# Patient Record
Sex: Female | Born: 1955 | Race: White | Hispanic: No | Smoking: Never smoker
Health system: Southern US, Community
[De-identification: ages and names within clinical notes are randomized; demographics above are authoritative.]

## PROBLEM LIST (undated history)

## (undated) ENCOUNTER — Emergency Department (HOSPITAL_COMMUNITY): Admission: EM | Payer: 59 | Source: Home / Self Care

## (undated) DIAGNOSIS — R21 Rash and other nonspecific skin eruption: Secondary | ICD-10-CM

## (undated) DIAGNOSIS — K5090 Crohn's disease, unspecified, without complications: Secondary | ICD-10-CM

## (undated) DIAGNOSIS — Z933 Colostomy status: Secondary | ICD-10-CM

## (undated) DIAGNOSIS — N950 Postmenopausal bleeding: Secondary | ICD-10-CM

## (undated) HISTORY — PX: TONSILLECTOMY: SUR1361

---

## 2006-07-02 ENCOUNTER — Other Ambulatory Visit: Admission: RE | Admit: 2006-07-02 | Discharge: 2006-07-02 | Payer: Self-pay | Admitting: Obstetrics & Gynecology

## 2006-07-28 ENCOUNTER — Encounter: Admission: RE | Admit: 2006-07-28 | Discharge: 2006-07-28 | Payer: Self-pay | Admitting: Obstetrics and Gynecology

## 2007-05-12 HISTORY — PX: HYSTEROSCOPY WITH D & C: SHX1775

## 2007-07-30 ENCOUNTER — Emergency Department (HOSPITAL_COMMUNITY): Admission: EM | Admit: 2007-07-30 | Discharge: 2007-07-30 | Payer: Self-pay | Admitting: Family Medicine

## 2010-02-07 ENCOUNTER — Ambulatory Visit (HOSPITAL_COMMUNITY): Admission: RE | Admit: 2010-02-07 | Discharge: 2010-02-07 | Payer: Self-pay | Admitting: Gastroenterology

## 2010-12-29 ENCOUNTER — Other Ambulatory Visit (HOSPITAL_COMMUNITY): Payer: Self-pay | Admitting: Gastroenterology

## 2010-12-29 ENCOUNTER — Ambulatory Visit (HOSPITAL_COMMUNITY)
Admission: RE | Admit: 2010-12-29 | Discharge: 2010-12-29 | Disposition: A | Discharge status: Home / Self Care | Payer: 59 | Source: Ambulatory Visit | Attending: Gastroenterology | Admitting: Gastroenterology

## 2010-12-29 DIAGNOSIS — Q619 Cystic kidney disease, unspecified: Secondary | ICD-10-CM | POA: Insufficient documentation

## 2010-12-29 DIAGNOSIS — R1033 Periumbilical pain: Secondary | ICD-10-CM

## 2010-12-29 DIAGNOSIS — K5010 Crohn's disease of large intestine without complications: Secondary | ICD-10-CM

## 2010-12-29 MED ORDER — IOHEXOL 300 MG/ML  SOLN
100.0000 mL | Freq: Once | INTRAMUSCULAR | Status: AC | PRN
  Administered 2010-12-29: 100 mL via INTRAVENOUS

## 2011-01-08 ENCOUNTER — Inpatient Hospital Stay (HOSPITAL_COMMUNITY)
Admission: EM | Admit: 2011-01-08 | Discharge: 2011-01-13 | DRG: 387 | Disposition: A | Discharge status: Home / Self Care | Payer: 59 | Attending: Gastroenterology | Admitting: Gastroenterology

## 2011-01-08 DIAGNOSIS — Z888 Allergy status to other drugs, medicaments and biological substances status: Secondary | ICD-10-CM

## 2011-01-08 DIAGNOSIS — E860 Dehydration: Secondary | ICD-10-CM | POA: Diagnosis present

## 2011-01-08 DIAGNOSIS — K5090 Crohn's disease, unspecified, without complications: Principal | ICD-10-CM | POA: Diagnosis present

## 2011-01-08 DIAGNOSIS — R197 Diarrhea, unspecified: Secondary | ICD-10-CM | POA: Diagnosis present

## 2011-01-08 DIAGNOSIS — R109 Unspecified abdominal pain: Secondary | ICD-10-CM | POA: Diagnosis present

## 2011-01-08 LAB — COMPREHENSIVE METABOLIC PANEL
Alkaline Phosphatase: 108 U/L (ref 39–117)
BUN: 14 mg/dL (ref 6–23)
CO2: 28 mEq/L (ref 19–32)
Calcium: 9.3 mg/dL (ref 8.4–10.5)
GFR calc non Af Amer: 51 mL/min — ABNORMAL LOW (ref >60)
Glucose, Bld: 91 mg/dL (ref 70–99)
Potassium: 4.2 mEq/L (ref 3.5–5.1)
Total Bilirubin: 0.4 mg/dL (ref 0.3–1.2)

## 2011-01-08 LAB — URINALYSIS, ROUTINE W REFLEX MICROSCOPIC
Glucose, UA: NEGATIVE mg/dL
Protein, ur: NEGATIVE mg/dL
Specific Gravity, Urine: 1.023 (ref 1.005–1.030)

## 2011-01-08 LAB — DIFFERENTIAL
Eosinophils Absolute: 0.8 10*3/uL — ABNORMAL HIGH (ref 0.0–0.7)
Lymphocytes Relative: 23 % (ref 12–46)
Lymphs Abs: 3.4 10*3/uL (ref 0.7–4.0)
Monocytes Relative: 13 % — ABNORMAL HIGH (ref 3–12)
Neutrophils Relative %: 59 % (ref 43–77)

## 2011-01-08 LAB — CBC
HCT: 35.3 % — ABNORMAL LOW (ref 36.0–46.0)
Hemoglobin: 11 g/dL — ABNORMAL LOW (ref 12.0–15.0)
MCHC: 31.2 g/dL (ref 30.0–36.0)
MCV: 77.9 fL — ABNORMAL LOW (ref 78.0–100.0)
Platelets: 359 10*3/uL (ref 150–400)
RDW: 13.9 % (ref 11.5–15.5)
WBC: 15 10*3/uL — ABNORMAL HIGH (ref 4.0–10.5)

## 2011-01-08 LAB — POCT I-STAT, CHEM 8: Potassium: 4 mEq/L (ref 3.5–5.1)

## 2011-01-09 LAB — CBC
HCT: 31.9 % — ABNORMAL LOW (ref 36.0–46.0)
Hemoglobin: 10 g/dL — ABNORMAL LOW (ref 12.0–15.0)
MCH: 24.4 pg — ABNORMAL LOW (ref 26.0–34.0)
RBC: 4.09 MIL/uL (ref 3.87–5.11)

## 2011-01-09 LAB — COMPREHENSIVE METABOLIC PANEL
ALT: 9 U/L (ref 0–35)
AST: 7 U/L (ref 0–37)
Albumin: 2.7 g/dL — ABNORMAL LOW (ref 3.5–5.2)
Alkaline Phosphatase: 99 U/L (ref 39–117)
Chloride: 102 mEq/L (ref 96–112)
Creatinine, Ser: 0.82 mg/dL (ref 0.50–1.10)
GFR calc Af Amer: >60 mL/min (ref >60)
GFR calc non Af Amer: >60 mL/min (ref >60)
Glucose, Bld: 113 mg/dL — ABNORMAL HIGH (ref 70–99)
Potassium: 4.1 mEq/L (ref 3.5–5.1)
Sodium: 137 mEq/L (ref 135–145)
Total Bilirubin: 0.3 mg/dL (ref 0.3–1.2)

## 2011-01-10 LAB — BASIC METABOLIC PANEL: Glucose, Bld: 109 mg/dL — ABNORMAL HIGH (ref 70–99)

## 2011-01-10 LAB — CBC: WBC: 8.5 10*3/uL (ref 4.0–10.5)

## 2011-01-11 LAB — BASIC METABOLIC PANEL
BUN: 13 mg/dL (ref 6–23)
Chloride: 107 mEq/L (ref 96–112)
GFR calc non Af Amer: >60 mL/min (ref >60)
Glucose, Bld: 112 mg/dL — ABNORMAL HIGH (ref 70–99)
Potassium: 4.1 mEq/L (ref 3.5–5.1)
Sodium: 141 mEq/L (ref 135–145)

## 2011-01-11 LAB — CBC
MCH: 24.7 pg — ABNORMAL LOW (ref 26.0–34.0)
MCV: 77.8 fL — ABNORMAL LOW (ref 78.0–100.0)
Platelets: 268 10*3/uL (ref 150–400)
RDW: 13.8 % (ref 11.5–15.5)

## 2011-01-12 LAB — BASIC METABOLIC PANEL
BUN: 16 mg/dL (ref 6–23)
Chloride: 109 mEq/L (ref 96–112)
Creatinine, Ser: 0.86 mg/dL (ref 0.50–1.10)
GFR calc Af Amer: >60 mL/min (ref >60)
GFR calc non Af Amer: >60 mL/min (ref >60)
Glucose, Bld: 100 mg/dL — ABNORMAL HIGH (ref 70–99)
Sodium: 141 mEq/L (ref 135–145)

## 2011-01-12 LAB — CBC
HCT: 28.4 % — ABNORMAL LOW (ref 36.0–46.0)
MCH: 24.5 pg — ABNORMAL LOW (ref 26.0–34.0)
RBC: 3.63 MIL/uL — ABNORMAL LOW (ref 3.87–5.11)
WBC: 10.9 10*3/uL — ABNORMAL HIGH (ref 4.0–10.5)

## 2011-01-13 LAB — BASIC METABOLIC PANEL
BUN: 16 mg/dL (ref 6–23)
CO2: 25 mEq/L (ref 19–32)
Calcium: 8.5 mg/dL (ref 8.4–10.5)
Chloride: 110 mEq/L (ref 96–112)
GFR calc Af Amer: >60 mL/min (ref >60)
GFR calc non Af Amer: >60 mL/min (ref >60)
Glucose, Bld: 90 mg/dL (ref 70–99)
Potassium: 4.3 mEq/L (ref 3.5–5.1)

## 2011-01-13 LAB — CBC
HCT: 27.2 % — ABNORMAL LOW (ref 36.0–46.0)
Hemoglobin: 8.7 g/dL — ABNORMAL LOW (ref 12.0–15.0)
MCH: 24.9 pg — ABNORMAL LOW (ref 26.0–34.0)
WBC: 11.2 10*3/uL — ABNORMAL HIGH (ref 4.0–10.5)

## 2011-01-13 LAB — HEPATITIS B SURFACE ANTIGEN: Hepatitis B Surface Ag: NEGATIVE

## 2011-01-14 ENCOUNTER — Encounter (HOSPITAL_COMMUNITY): Payer: 59

## 2011-01-14 ENCOUNTER — Inpatient Hospital Stay (HOSPITAL_COMMUNITY)
Admission: EM | Admit: 2011-01-14 | Discharge: 2011-01-20 | DRG: 386 | Disposition: A | Discharge status: Home / Self Care | Payer: 59 | Attending: Gastroenterology | Admitting: Gastroenterology

## 2011-01-14 DIAGNOSIS — K648 Other hemorrhoids: Secondary | ICD-10-CM | POA: Diagnosis present

## 2011-01-14 DIAGNOSIS — K644 Residual hemorrhoidal skin tags: Secondary | ICD-10-CM | POA: Diagnosis present

## 2011-01-14 DIAGNOSIS — D638 Anemia in other chronic diseases classified elsewhere: Secondary | ICD-10-CM | POA: Diagnosis present

## 2011-01-14 DIAGNOSIS — D62 Acute posthemorrhagic anemia: Secondary | ICD-10-CM | POA: Diagnosis present

## 2011-01-14 DIAGNOSIS — K5100 Ulcerative (chronic) pancolitis without complications: Principal | ICD-10-CM | POA: Diagnosis present

## 2011-01-14 LAB — CBC
HCT: 33.2 % — ABNORMAL LOW (ref 36.0–46.0)
MCV: 77 fL — ABNORMAL LOW (ref 78.0–100.0)
RBC: 4.31 MIL/uL (ref 3.87–5.11)
WBC: 11.7 10*3/uL — ABNORMAL HIGH (ref 4.0–10.5)

## 2011-01-14 LAB — BASIC METABOLIC PANEL
BUN: 10 mg/dL (ref 6–23)
CO2: 27 mEq/L (ref 19–32)
Chloride: 103 mEq/L (ref 96–112)
Creatinine, Ser: 0.9 mg/dL (ref 0.50–1.10)
Glucose, Bld: 83 mg/dL (ref 70–99)

## 2011-01-14 LAB — ABO/RH: ABO/RH(D): O POS

## 2011-01-14 LAB — DIFFERENTIAL
Lymphocytes Relative: 19 % (ref 12–46)
Lymphs Abs: 2.3 10*3/uL (ref 0.7–4.0)
Neutrophils Relative %: 65 % (ref 43–77)

## 2011-01-15 ENCOUNTER — Encounter (HOSPITAL_COMMUNITY): Payer: 59

## 2011-01-15 LAB — HEPATIC FUNCTION PANEL
AST: 22 U/L (ref 0–37)
Albumin: 2.5 g/dL — ABNORMAL LOW (ref 3.5–5.2)
Alkaline Phosphatase: 101 U/L (ref 39–117)
Total Bilirubin: 0.5 mg/dL (ref 0.3–1.2)

## 2011-01-15 LAB — CBC
HCT: 30.3 % — ABNORMAL LOW (ref 36.0–46.0)
Hemoglobin: 9.9 g/dL — ABNORMAL LOW (ref 12.0–15.0)
RDW: 13.9 % (ref 11.5–15.5)
WBC: 8.8 10*3/uL (ref 4.0–10.5)

## 2011-01-15 LAB — BASIC METABOLIC PANEL
BUN: 8 mg/dL (ref 6–23)
GFR calc non Af Amer: >60 mL/min (ref >60)
Glucose, Bld: 97 mg/dL (ref 70–99)
Potassium: 4.9 mEq/L (ref 3.5–5.1)

## 2011-01-16 ENCOUNTER — Other Ambulatory Visit: Payer: Self-pay | Admitting: Gastroenterology

## 2011-01-16 DIAGNOSIS — K5289 Other specified noninfective gastroenteritis and colitis: Secondary | ICD-10-CM

## 2011-01-16 DIAGNOSIS — K5100 Ulcerative (chronic) pancolitis without complications: Secondary | ICD-10-CM

## 2011-01-16 LAB — CBC
HCT: 26 % — ABNORMAL LOW (ref 36.0–46.0)
Hemoglobin: 8.3 g/dL — ABNORMAL LOW (ref 12.0–15.0)
MCH: 24.4 pg — ABNORMAL LOW (ref 26.0–34.0)
MCHC: 31.9 g/dL (ref 30.0–36.0)
MCV: 76.5 fL — ABNORMAL LOW (ref 78.0–100.0)

## 2011-01-17 DIAGNOSIS — K5190 Ulcerative colitis, unspecified, without complications: Secondary | ICD-10-CM

## 2011-01-18 DIAGNOSIS — K5190 Ulcerative colitis, unspecified, without complications: Secondary | ICD-10-CM

## 2011-01-18 LAB — DIFFERENTIAL
Basophils Relative: 0 % (ref 0–1)
Eosinophils Absolute: 0 10*3/uL (ref 0.0–0.7)
Eosinophils Relative: 0 % (ref 0–5)
Monocytes Absolute: 1.1 10*3/uL — ABNORMAL HIGH (ref 0.1–1.0)
Monocytes Relative: 12 % (ref 3–12)
Neutrophils Relative %: 75 % (ref 43–77)

## 2011-01-18 LAB — BASIC METABOLIC PANEL
Calcium: 8.6 mg/dL (ref 8.4–10.5)
Creatinine, Ser: 0.73 mg/dL (ref 0.50–1.10)
GFR calc non Af Amer: >60 mL/min (ref >60)
Sodium: 139 mEq/L (ref 135–145)

## 2011-01-18 LAB — CBC
MCH: 24.3 pg — ABNORMAL LOW (ref 26.0–34.0)
MCHC: 31.6 g/dL (ref 30.0–36.0)
Platelets: 272 10*3/uL (ref 150–400)
RDW: 14 % (ref 11.5–15.5)

## 2011-01-19 ENCOUNTER — Inpatient Hospital Stay (HOSPITAL_COMMUNITY): Payer: 59

## 2011-01-20 LAB — COMPREHENSIVE METABOLIC PANEL
ALT: 35 U/L (ref 0–35)
AST: 13 U/L (ref 0–37)
Albumin: 2.5 g/dL — ABNORMAL LOW (ref 3.5–5.2)
CO2: 29 mEq/L (ref 19–32)
Calcium: 8.4 mg/dL (ref 8.4–10.5)
Chloride: 102 mEq/L (ref 96–112)
GFR calc non Af Amer: >60 mL/min (ref >60)
Sodium: 135 mEq/L (ref 135–145)
Total Bilirubin: 0.3 mg/dL (ref 0.3–1.2)

## 2011-01-20 LAB — CBC
Platelets: 249 10*3/uL (ref 150–400)
RBC: 3.45 MIL/uL — ABNORMAL LOW (ref 3.87–5.11)
RDW: 14.1 % (ref 11.5–15.5)
WBC: 6.7 10*3/uL (ref 4.0–10.5)

## 2011-01-20 LAB — PREALBUMIN: Prealbumin: 17 mg/dL — ABNORMAL LOW (ref 17.0–34.0)

## 2011-01-20 LAB — IRON: Iron: 417 ug/dL — ABNORMAL HIGH (ref 42–135)

## 2011-01-22 ENCOUNTER — Encounter (HOSPITAL_COMMUNITY)
Admission: RE | Admit: 2011-01-22 | Discharge: 2011-01-22 | Disposition: A | Discharge status: Home / Self Care | Payer: 59 | Source: Ambulatory Visit | Attending: Gastroenterology | Admitting: Gastroenterology

## 2011-01-22 DIAGNOSIS — K5090 Crohn's disease, unspecified, without complications: Secondary | ICD-10-CM | POA: Insufficient documentation

## 2011-01-23 NOTE — Discharge Summary (Signed)
  Karen [name], VADALA NO.:  [id number]  MEDICAL RECORD NO.:  [id number]  LOCATION:  3025                         FACILITY:  [facility]  PHYSICIAN:  Jordan Hawks. Elnoria Howard, MD    DATE OF BIRTH:  1956/02/02  DATE OF ADMISSION:  01/08/2011 DATE OF DISCHARGE:  01/13/2011                              DISCHARGE SUMMARY   DISCHARGE DIAGNOSES: 1. Crohn's flare. 2. Abdominal pain secondary to the Crohn's flare.  HISTORY OF PRESENT ILLNESS:  Please see original H and P for full details.  HOSPITAL COURSE: 1. The patient was admitted to the hospital for dehydration and     persistent bloody diarrhea.  She was started on IV hydration as     well as Solu-Medrol 40 mg IV q.12 hours and Asacol 4.8 g daily.     During the second hospital stay, the patient did report feeling     better because of the IV hydration, however, over the intervening     days, her bowel movements still were rather voluminous where she     ranged anywhere from 13-20 bowel movements per day.  No reports of     any fever.  Her hemoglobin did drop down over the intervening time     period secondary to the hydration, initially she was at 12.2, but     it dropped down to 8.7 which is most likely her baseline level at     this time with a chronic bloody diarrhea.  No reports of any fever.     She did have some nausea and vomiting issues.  It was apparent that     the Solu-Medrol was not controlling her symptoms adequately with     the mesalamine therefore, decision was made to initiate on     Remicade.  Unfortunately, Remicade could only be provided on the     outpatient basis.  Plans at the time of discharge are underway to     have her undergo the Remicade infusion.  She has already had a PPD     read that is current and most likely she has immunization against     hepatitis B, however, I did order hepatitis B surface antibody     antigen to ensure complete documentation before the initiation of     Remicade.   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  Plan at     this time is to remain on a prednisone 30 mg p.o. daily. 2. She will be discharged with Zofran 4 mg p.o. q.8 hours p.r.n. and     Percocet 5/325 mg one p.o. q.4 hours p.r.n.  The patient will     follow up with Dr. Loreta Ave in 1 week.     Jordan Hawks Elnoria Howard, MD     PDH/MEDQ  D:  01/13/2011  T:  01/13/2011  Job:  [id number]  Electronically Signed by Jeani Hawking MD on 01/23/2011 08:49:08 AM

## 2011-01-23 NOTE — H&P (Signed)
NAMETIFFINEY, SPARROW NO.:  [id number]  MEDICAL RECORD NO.:  [id number]  LOCATION:  3025                         FACILITY:  [facility]  PHYSICIAN:  Jordan Hawks. Elnoria Howard, MD    DATE OF BIRTH:  11/25/1955  DATE OF ADMISSION:  01/08/2011 DATE OF DISCHARGE:                             HISTORY & PHYSICAL   REASON FOR ADMISSION:  Crohn's flare.  HISTORY OF PRESENT ILLNESS:  This is a 55 year old female with a past medical history of Crohn's colitis that was diagnosed in September 2011. At that time, the patient had complaints of hematochezia and was feeling unwell.  She subsequently underwent a colonoscopy with Dr. Loreta Ave and she was identified to have Crohn's colitis.  The patient was started on Lialda and subsequently her symptoms resolved.  She felt well and did not have any further issues.  After January 2012, her prescription ran out, she did not realize that she required continue treatment with the Lialda and subsequently she was without any types of medications and her symptoms started to flare.  Earlier in midsummer at that time in early July 2012, she was started on Lialda again, unfortunately over the months.  It did not help to improve her symptoms and she also had a reaction to Lialda.  This was stopped and then she was started on prednisone 2 weeks ago to be on the taper.  Unfortunately, her symptoms did not improve with treatment.  She subsequently told to present to the emergency room for further evaluation and treatment.  At this time, the patient reports having 20 bloody bowel movements per day.  She is tired. She has abdominal pain.  Approximately 10 days ago, she underwent a CT scan of her abdomen which revealed a thickening in the sigmoid colon, also secondary omental inflammation which is consistent with her colitis.  There is no recent history of antibiotic use.  She does work as a Engineer, civil (consulting) and she is in contact with other health care professionals  on routine basis, therefore infectious etiologies can be a consideration.  PAST MEDICAL HISTORY AND PAST SURGICAL HISTORY:  As stated above.  FAMILY HISTORY:  Noncontributory.  SOCIAL HISTORY:  Negative for alcohol, tobacco, or illicit drug use.  ALLERGIES:  LIALDA.  CURRENT MEDICATIONS:  Prednisone 30 mg p.o. daily.  REVIEW OF SYSTEMS:  As stated above in the history of present illness, otherwise negative.  PHYSICAL EXAMINATION:  VITAL SIGNS:  Stable. GENERAL:  The patient is in no acute distress, alert and oriented although she appears to be fatigued. HEENT:  Normocephalic, atraumatic.  Extraocular muscles intact. NECK:  Supple.  No lymphadenopathy. LUNGS:  Clear to auscultation bilaterally. CARDIOVASCULAR:  Regular rate and rhythm. ABDOMEN:  Obese, soft, diffusely tender, no rebound or rigidity. Positive bowel sounds. EXTREMITIES:  No clubbing, cyanosis, or edema.  Laboratory values are pending at this time.  IMPRESSION: 1. Crohn's flare. 2. Diarrhea. 3. Hematochezia. 4. Abdominal pain.  With the patient's clinical presentation of an inability to tolerate p.o. because of the nausea and vomiting, she will require admission to the hospital.  At this time, IV Solu-Medrol will be initiated on the patient and hopefully this will help to improve her  symptoms.  She will also be tried on Asacol.  Hopefully with antiemetics, she can also tolerate the p.o. medication.  There may be a possibility of repeating colonoscopy or flexible sigmoidoscopy to further evaluate her inflammation to see if it has extended beyond the current site in the sigmoid colon.  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  PLAN: 1. To admit. 2. IV hydration. 3. Start Solu-Medrol. 4. Asacol 5. Check for Clostridium difficile PCR.     Jordan Hawks Elnoria Howard, MD     PDH/MEDQ  D:  01/08/2011  T:   01/08/2011  Job:  [id number]  Electronically Signed by Jeani Hawking MD on 01/23/2011 08:49:10 AM

## 2011-01-27 ENCOUNTER — Inpatient Hospital Stay (HOSPITAL_COMMUNITY): Payer: 59

## 2011-01-27 ENCOUNTER — Inpatient Hospital Stay (HOSPITAL_COMMUNITY)
Admission: AD | Admit: 2011-01-27 | Discharge: 2011-02-01 | DRG: 385 | Disposition: A | Discharge status: Home / Self Care | Payer: 59 | Source: Ambulatory Visit | Attending: Internal Medicine | Admitting: Internal Medicine

## 2011-01-27 DIAGNOSIS — K5010 Crohn's disease of large intestine without complications: Principal | ICD-10-CM | POA: Diagnosis present

## 2011-01-27 DIAGNOSIS — I498 Other specified cardiac arrhythmias: Secondary | ICD-10-CM | POA: Diagnosis not present

## 2011-01-27 DIAGNOSIS — E876 Hypokalemia: Secondary | ICD-10-CM | POA: Diagnosis present

## 2011-01-27 DIAGNOSIS — IMO0002 Reserved for concepts with insufficient information to code with codable children: Secondary | ICD-10-CM

## 2011-01-27 DIAGNOSIS — N390 Urinary tract infection, site not specified: Secondary | ICD-10-CM | POA: Diagnosis present

## 2011-01-27 DIAGNOSIS — Q619 Cystic kidney disease, unspecified: Secondary | ICD-10-CM

## 2011-01-27 DIAGNOSIS — A498 Other bacterial infections of unspecified site: Secondary | ICD-10-CM | POA: Diagnosis present

## 2011-01-27 DIAGNOSIS — D62 Acute posthemorrhagic anemia: Secondary | ICD-10-CM | POA: Diagnosis not present

## 2011-01-27 DIAGNOSIS — E2749 Other adrenocortical insufficiency: Secondary | ICD-10-CM | POA: Diagnosis present

## 2011-01-27 DIAGNOSIS — E441 Mild protein-calorie malnutrition: Secondary | ICD-10-CM | POA: Diagnosis present

## 2011-01-27 DIAGNOSIS — R112 Nausea with vomiting, unspecified: Secondary | ICD-10-CM | POA: Diagnosis present

## 2011-01-27 DIAGNOSIS — R578 Other shock: Secondary | ICD-10-CM | POA: Diagnosis not present

## 2011-01-27 LAB — CBC
HCT: 24.3 % — ABNORMAL LOW (ref 36.0–46.0)
Hemoglobin: 7.7 g/dL — ABNORMAL LOW (ref 12.0–15.0)
MCHC: 31.7 g/dL (ref 30.0–36.0)
RBC: 3.16 MIL/uL — ABNORMAL LOW (ref 3.87–5.11)

## 2011-01-27 LAB — HEPATIC FUNCTION PANEL
ALT: 13 U/L (ref 0–35)
AST: 8 U/L (ref 0–37)
Bilirubin, Direct: 0.1 mg/dL (ref 0.0–0.3)
Total Bilirubin: 0.3 mg/dL (ref 0.3–1.2)

## 2011-01-27 LAB — BASIC METABOLIC PANEL
BUN: 9 mg/dL (ref 6–23)
Creatinine, Ser: 0.79 mg/dL (ref 0.50–1.10)
GFR calc Af Amer: >60 mL/min (ref >60)
GFR calc non Af Amer: >60 mL/min (ref >60)

## 2011-01-27 LAB — PROCALCITONIN: Procalcitonin: 0.27 ng/mL

## 2011-01-28 DIAGNOSIS — E2749 Other adrenocortical insufficiency: Secondary | ICD-10-CM

## 2011-01-28 DIAGNOSIS — D649 Anemia, unspecified: Secondary | ICD-10-CM

## 2011-01-28 DIAGNOSIS — A419 Sepsis, unspecified organism: Secondary | ICD-10-CM

## 2011-01-28 DIAGNOSIS — K5080 Crohn's disease of both small and large intestine without complications: Secondary | ICD-10-CM

## 2011-01-28 LAB — URINALYSIS, ROUTINE W REFLEX MICROSCOPIC
Bilirubin Urine: NEGATIVE
Glucose, UA: NEGATIVE mg/dL
Ketones, ur: NEGATIVE mg/dL
Protein, ur: NEGATIVE mg/dL

## 2011-01-28 LAB — CBC
HCT: 28.1 % — ABNORMAL LOW (ref 36.0–46.0)
Hemoglobin: 9.1 g/dL — ABNORMAL LOW (ref 12.0–15.0)
MCH: 25.7 pg — ABNORMAL LOW (ref 26.0–34.0)
MCHC: 32.4 g/dL (ref 30.0–36.0)
MCHC: 33.4 g/dL (ref 30.0–36.0)
MCV: 76.9 fL — ABNORMAL LOW (ref 78.0–100.0)
Platelets: UNDETERMINED 10*3/uL (ref 150–400)
RBC: 3.62 MIL/uL — ABNORMAL LOW (ref 3.87–5.11)

## 2011-01-28 LAB — MRSA PCR SCREENING: MRSA by PCR: NEGATIVE

## 2011-01-28 LAB — COMPREHENSIVE METABOLIC PANEL
ALT: 11 U/L (ref 0–35)
Alkaline Phosphatase: 84 U/L (ref 39–117)
BUN: 5 mg/dL — ABNORMAL LOW (ref 6–23)
CO2: 26 mEq/L (ref 19–32)
Chloride: 103 mEq/L (ref 96–112)
GFR calc Af Amer: >60 mL/min (ref >60)
GFR calc non Af Amer: >60 mL/min (ref >60)
Glucose, Bld: 90 mg/dL (ref 70–99)
Potassium: 3.7 mEq/L (ref 3.5–5.1)
Total Bilirubin: 0.6 mg/dL (ref 0.3–1.2)
Total Protein: 4.7 g/dL — ABNORMAL LOW (ref 6.0–8.3)

## 2011-01-28 LAB — URINE MICROSCOPIC-ADD ON

## 2011-01-28 LAB — MAGNESIUM: Magnesium: 1.8 mg/dL (ref 1.5–2.5)

## 2011-01-28 MED ORDER — IOHEXOL 300 MG/ML  SOLN
100.0000 mL | Freq: Once | INTRAMUSCULAR | Status: AC | PRN
  Administered 2011-01-28: 100 mL via INTRAVENOUS

## 2011-01-29 ENCOUNTER — Inpatient Hospital Stay (HOSPITAL_COMMUNITY): Payer: 59

## 2011-01-29 LAB — CROSSMATCH: Unit division: 0

## 2011-01-29 LAB — COMPREHENSIVE METABOLIC PANEL
CO2: 25 mEq/L (ref 19–32)
Calcium: 8.6 mg/dL (ref 8.4–10.5)
Creatinine, Ser: 0.84 mg/dL (ref 0.50–1.10)
GFR calc Af Amer: >60 mL/min (ref >60)
GFR calc non Af Amer: >60 mL/min (ref >60)
Glucose, Bld: 118 mg/dL — ABNORMAL HIGH (ref 70–99)

## 2011-01-29 LAB — CBC
HCT: 27.9 % — ABNORMAL LOW (ref 36.0–46.0)
Hemoglobin: 8.8 g/dL — ABNORMAL LOW (ref 12.0–15.0)
MCH: 24.6 pg — ABNORMAL LOW (ref 26.0–34.0)
MCHC: 31.5 g/dL (ref 30.0–36.0)
MCV: 78.2 fL (ref 78.0–100.0)
Platelets: 219 10*3/uL (ref 150–400)
RBC: 3.57 MIL/uL — ABNORMAL LOW (ref 3.87–5.11)
RDW: 15.8 % — ABNORMAL HIGH (ref 11.5–15.5)
WBC: 4.9 10*3/uL (ref 4.0–10.5)

## 2011-01-29 LAB — PROTIME-INR: INR: 1.33 (ref 0.00–1.49)

## 2011-01-29 LAB — APTT: aPTT: 31 seconds (ref 24–37)

## 2011-01-29 LAB — PRO B NATRIURETIC PEPTIDE: Pro B Natriuretic peptide (BNP): 833 pg/mL — ABNORMAL HIGH (ref 0–125)

## 2011-01-29 NOTE — Consult Note (Signed)
NAME:  Karen, [name] NO.:  [id number]  MEDICAL RECORD NO.:  [id number]  LOCATION:  4507                         FACILITY:  [facility]  PHYSICIAN:  Adolph Pollack, M.D.DATE OF BIRTH:  [birth year]  DATE OF CONSULTATION:  01/16/2011 DATE OF DISCHARGE:                                CONSULTATION   REQUESTING PHYSICIAN:  Jyothi Elsie Amis, MD, [facility]  CONSULTING SURGEON:  Adolph Pollack, MD  REASON FOR CONSULTATION:  Evaluation and standby for toxic megacolon.  HISTORY OF PRESENT ILLNESS:  Karen [name] is a 55 year old white female who has a history of Crohn colitis, who was diagnosed last year.  She initially was having multiple bloody bowel movements every day and was put on Lialda for this.  This initially helped, but ultimately began to fail.  Earlier this summer, the patient once again began having significant number of bloody bowel movements every day.  She was having some nausea and vomiting and abdominal pain.  At that time, she was placed on a steroid taper for 2 weeks, however, she did not improve with this.  At this time, she did have a CT scan of the abdomen and pelvis which revealed some thickening of the sigmoid colon, compatible with segmental omental colitis consistent with a history of Crohn disease. She was later admitted for IV fluid hydration and further treatment. She was given a dose of Remicade.  She has made some improvements with this, however, she is still having multiple episodes of bloody bowel movements every day with nausea, vomiting with p.o. intake.  She is also having some abdominal pain.  The Gastroenterology Service plans to do a flexible sigmoidoscopy today for further evaluation.  The patient denies any fevers or chills or general malaise.  We were asked to evaluate the patient and be aware if the patient in case she developed toxic megacolon secondary to a Crohn colitis.  REVIEW OF SYSTEMS:  Please see HPI, otherwise all  other systems have been reviewed and are negative.  FAMILY HISTORY:  Noncontributory.  PAST MEDICAL HISTORY:  Crohn colitis.  PAST SURGICAL HISTORY:  Tonsillectomy.  SOCIAL HISTORY:  The patient is a Engineer, civil (consulting).  She denies any alcohol, tobacco, or illicit drug abuse.  ALLERGIES:  SULFA.  MEDICATIONS PRIOR TO ADMISSION:  Prednisone 30 mg p.o. daily which I believe just prior to admission, she had already completed.  PHYSICAL EXAMINATION:  GENERAL:  Karen [name] is a very pleasant well- developed, well-nourished 55 year old white female who is currently lying in bed in no acute distress. VITAL SIGNS:  Temperature 97.0, pulse 58, respirations 20, blood pressure 150/71. HEENT:  Head is normocephalic, atraumatic.  Sclerae noninjected.  Pupils are equal, round, and reactive to light.  Ears and nose without any obvious masses or lesions.  No rhinorrhea.  Mouth is pink.  Throat shows no exudate. HEART:  Regular rate and rhythm.  Normal S1, S2.  No murmurs, gallops, or rubs are noted.  She does have palpable carotid, radial and pedal pulses bilaterally. LUNGS:  Clear to auscultation bilaterally with no wheezes, rhonchi, or rales noted.  Respiratory effort is nonlabored. ABDOMEN:  Soft and nondistended.  She does have some diffuse moderate  tenderness.  She does not have any peritoneal signs, rebounding, or guarding.  She does have active bowel sounds.  She otherwise does not have any masses, hernias, or organomegaly noted. RECTAL:  Deferred at this time. MUSCULOSKELETAL:  All four extremities are symmetrical with no cyanosis, clubbing, or edema. SKIN:  Warm and dry with no masses, lesions, or rashes. PSYCHIATRY:  The patient is alert and oriented x3 with an appropriate affect.  LABS AND DIAGNOSTICS:  White blood cell count 7100, hemoglobin 8.3, hematocrit 26.0, platelet count 258,000.  Sodium 138, potassium 4.9, BUN 8, creatinine 0.61.  CT scan of the abdomen pelvis done on August 20  reveals focal wall thickening involving the sigmoid colon compatible with a colitis consistent with a history of Crohn disease.  There is no evidence of bowel dilatation.  IMPRESSION: 1. Crohn colitis without evidence of toxic megacolon currently. 2. Acute blood loss anemia from extensive hematochezia.  PLAN:  The treatment of the patient's Crohn colitis will be continued per the Gastroenterology Service.  Currently, the patient does not appear toxic or distended consistent with toxic megacolon.  If the patient were just began developing symptoms, we are aware of her. Currently, there is no surgical intervention needed or planned.  Please call us if the patient's condition changes.     Letha Cape, PA   ______________________________ Adolph Pollack, M.D.    KEO/MEDQ  D:  01/16/2011  T:  01/16/2011  Job:  [id number]  cc:   Anselmo Rod, MD, [facility]  Electronically Signed by Barnetta Chapel PA on 01/19/2011 03:37:47 PM Electronically Signed by Avel Peace M.D. on 01/29/2011 09:16:53 AM

## 2011-01-30 LAB — BASIC METABOLIC PANEL
CO2: 23 mEq/L (ref 19–32)
Calcium: 8.6 mg/dL (ref 8.4–10.5)
Creatinine, Ser: 0.95 mg/dL (ref 0.50–1.10)
Glucose, Bld: 101 mg/dL — ABNORMAL HIGH (ref 70–99)

## 2011-01-30 LAB — CBC
Hemoglobin: 8.7 g/dL — ABNORMAL LOW (ref 12.0–15.0)
MCH: 24.8 pg — ABNORMAL LOW (ref 26.0–34.0)
MCV: 78.1 fL (ref 78.0–100.0)
RBC: 3.51 MIL/uL — ABNORMAL LOW (ref 3.87–5.11)

## 2011-01-30 LAB — TSH: TSH: 0.427 u[IU]/mL (ref 0.350–4.500)

## 2011-01-31 DIAGNOSIS — K5010 Crohn's disease of large intestine without complications: Secondary | ICD-10-CM

## 2011-01-31 DIAGNOSIS — R112 Nausea with vomiting, unspecified: Secondary | ICD-10-CM

## 2011-01-31 LAB — URINE CULTURE

## 2011-01-31 LAB — CBC
MCV: 78.2 fL (ref 78.0–100.0)
Platelets: 220 10*3/uL (ref 150–400)
RBC: 3.48 MIL/uL — ABNORMAL LOW (ref 3.87–5.11)
WBC: 6.6 10*3/uL (ref 4.0–10.5)

## 2011-01-31 LAB — BASIC METABOLIC PANEL
BUN: 8 mg/dL (ref 6–23)
CO2: 27 mEq/L (ref 19–32)
Calcium: 8.7 mg/dL (ref 8.4–10.5)
Chloride: 107 mEq/L (ref 96–112)
GFR calc Af Amer: >60 mL/min (ref >60)
GFR calc non Af Amer: 56 mL/min — ABNORMAL LOW (ref >60)
Potassium: 4.3 mEq/L (ref 3.5–5.1)

## 2011-02-01 DIAGNOSIS — K5010 Crohn's disease of large intestine without complications: Secondary | ICD-10-CM

## 2011-02-01 DIAGNOSIS — R112 Nausea with vomiting, unspecified: Secondary | ICD-10-CM

## 2011-02-01 LAB — BASIC METABOLIC PANEL
BUN: 7 mg/dL (ref 6–23)
Calcium: 8.3 mg/dL — ABNORMAL LOW (ref 8.4–10.5)
GFR calc Af Amer: >60 mL/min (ref >60)
GFR calc non Af Amer: >60 mL/min (ref >60)
Glucose, Bld: 81 mg/dL (ref 70–99)

## 2011-02-02 LAB — CULTURE, BLOOD (ROUTINE X 2)
Culture  Setup Time: 201209182229
Culture: NO GROWTH
Culture: NO GROWTH

## 2011-02-05 NOTE — Discharge Summary (Signed)
NAME:  Karen [name], MORO NO.:  [id number]  MEDICAL RECORD NO.:  [id number]  LOCATION:  3704                         FACILITY:  [facility]  PHYSICIAN:  Lonia Blood, M.D.DATE OF BIRTH:  1956-04-05  DATE OF ADMISSION:  01/27/2011 DATE OF DISCHARGE:  02/01/2011                              DISCHARGE SUMMARY   PRIMARY PROVIDER:  Anselmo Rod, MD, [facility] - GI physician.  DISCHARGE DIAGNOSES: 1. Acute Crohn's colitis exacerbation.     a.     GI followup throughout hospital stay.     b.     Responded to systemic steroid therapy.     c.     Received a course of empiric antibiotic therapy.     d.     Having firm bowel movements with no further bloody stools at      the time of discharge.     e.     To resume Imuran and prednisone in the outpatient setting.     f.     To resume Remicade under the direction of Dr. Loreta Ave in the      outpatient setting. 2. Hypotension/hypovolemic shock.     a.     High-volume IV fluid resuscitation required.     b.     No pressors required.     c.     Transiently under the care of Critical Care Medicine. 3. Relative adrenal insufficiency.     a.     Secondary to ongoing steroid use in the outpatient setting      due to indication above.     b.     Received stress dose steroids during hospital stay, which      were then tapered back to her chronic prednisone dose. 4. Acute blood loss anemia in the setting of anemia of chronic     disease.     a.     Hemoglobin nadir of 7.7.     b.     Source of blood loss is colitis with bloody diarrhea.     c.     Hemoglobin at the time of discharge 8.6. 5. Acute E. coli urinary tract infection - has completed antibiotic     course with ciprofloxacin. 6. Severe bradycardia.     a.     Evaluated by Cardiology during hospital stay.     b.     Felt to be vaguely mediated.     c.     Resolved with resolution of intractable vomiting. 7. Mild protein-calorie malnutrition - oral intake encouraged. 8.  Hypokalemia - due to poor p.o. intake - repleted and resolved at     the time of discharge with discharge potassium of 3.7. 9. Known complex right renal cyst via CT scan of the abdomen on December 29, 2010, - consider outpatient MRI for evaluation at discretion of     Dr. Loreta Ave.  DISCHARGE MEDICATIONS:  Complete list of the patient's discharge medications is available as per the discharge med manager portion of the patient's e-chart computer file.  Of note, Protonix 40 mg p.o. daily has been added to her regimen at discharge at the  recommendation of GI. Otherwise, she will resume her Imuran and prednisone at her previous home doses of 50 mg a day and 30 mg a day respectively.  FOLLOWUP: 1. The patient will follow up with Dr. Loreta Ave as discussed between Dr.     Loreta Ave and the patient.  They indeed had telephone conversation prior     to the patient's discharge. 2. The patient will follow Dr. Armanda Magic with [facility] on     an as-needed basis.  CONSULTATIONS DURING THIS HOSPITAL STAY: 1. Pulmonary Critical Care Medicine. 2. [facility]. 3. Gastroenterology, Dr. Shirl Harris GI. 4. General Surgery - Dr. Chevis Pretty.  PROCEDURES DURING HOSPITAL STAY: 1. Diagnostic flex sig on January 27, 2011, - slight improvement in     the rectal and rectosigmoid mucosa, but severe colitis beyond 15 cm     up to 50 cm. 2. CT scan abdomen and pelvis January 28, 2011 - continuous colitis     from the rectum to the hepatic flexure in keeping with a stated     history of chronic inflammatory bowel disease.  Complex right renal     lower pole cystic lesion with then calcified septations without     interval change.  No definite solid areas of enhancement.  MRI is     suggested as a followup study of choice.  HOSPITAL FOLLOWUP:  Karen [name] is a pleasant 55 year old female who is well-known to Dr. Charna Elizabeth.  She has a history of recently diagnosed Crohn's colitis.  Her course  has been quite complex dating back to 2011 when she first came under the care of Dr. Loreta Ave.  For details concerning her prior history, please see the admission history and physical per Dr. Loreta Ave on January 27, 2011.  In short, the patient was admitted to [facility] on January 27, 2011, when she was found to be suffering with fever of unknown origin and a suspected severe exacerbation of her Crohn's colitis.  She was admitted to the acute unit.  She was found to be significantly hypotensive and febrile. Pulmonary Critical Care assumed care of the patient given the evidence of early sepsis.  She was felt to be suffering with acute adrenal insufficiency.  She was dosed with stress dose steroids given the fact that she is on chronic steroids for her colitis.  Hypokalemia was appreciated and also replaced.  Dr. Loreta Ave followed the patient.  The patient was taken for flexible sigmoidoscopy with results as noted above.  The patient was treated empirically with Cipro and Flagyl.  She was aggressively volume resuscitated.  The patient stabilized from a hemodynamic standpoint.  General Surgery was consulted and did not feel the patient appeared toxic and did not feel that acute surgical intervention was required.  As the patient stabilized, she was transferred to step-down unit.  At this point, GI requested that the [facility] assume primary care of the patient in place of the pulmonary critical care physicians.  The ongoing problem at that time was that of persistent nausea with frequent bouts of vomiting.  This was felt to be related to the patient's colitis.  Additionally, the patient developed episodes of severe bradycardia.  Heart rate was down into the high 20s at times.  The patient was not orthostatic at that time, but the patient's bradycardia did coincide with episodes of vomiting.  Cardiology consultation was felt to be appropriate and at the patient's request [facility]  was consulted.  Dr. Donato Schultz saw  the patient on behalf of Dr. Armanda Magic and agreed that these episodes of bradycardia were likely vagally mediated.  He did not feel that further cardiac evaluation was indicated.  At this point, attention was turned to transition the patient to p.o. diet, decreasing her IV fluids, and addressing her nausea and vomiting.  Empiric proton pump inhibitors were administered. Antibiotics were continued.  Urinalysis was assessed and was also found to be suggestive of urinary tract infection.  Urine culture ultimately revealed E. coli.  The patient's Cipro, which was initiated at the time of her presentation, was proven to be adequate coverage for this.  As the patient's hospital stay progressed, she slowly, but steadily improved.  With gentle slow downward titration of her steroids and gentle slow advancement of her diet, she began to thrive.  By February 01, 2011, she was felt to be stable for discharge.  The GI service evaluated her and agreed.  Medical Service also felt that she was appropriate for discharge.  She was able to tolerate p.o. intake and had no new complaints.  Followup has been arranged by Dr. Loreta Ave in direct conversation with the patient.  At the time of the patient's discharge, she is without complaints.  She is actually having formed bowel movements with no bleeding.  Her nausea and vomiting is very well-controlled and she is tolerating oral intake without difficulty.  She denies chest pain, shortness of breath, fevers, or chills.  PHYSICAL EXAMINATION:  VITAL SIGNS:  Temperature is 96, blood pressure 127/82, heart rate 64, respiratory rate 18, O2 sats 99% on room air. LUNGS:  Clear to auscultation bilaterally. CARDIOVASCULAR:  Regular rate and rhythm without murmur, gallop, or rub. Normal S1 and S2. ABDOMEN:  Soft.  Bowel sounds present.  No organomegaly, no rebound, no ascites. EXTREMITIES:  2+ bilateral lower extremity  edema.     Lonia Blood, M.D.     JTM/MEDQ  D:  02/01/2011  T:  02/01/2011  Job:  [id number]  cc:   Anselmo Rod, MD, [facility]  Electronically Signed by Jetty Duhamel M.D. on 02/05/2011 09:48:56 AM

## 2011-02-06 NOTE — Consult Note (Signed)
NAME:  Karen [name], COPPOCK NO.:  [id number]  MEDICAL RECORD NO.:  [id number]  LOCATION:                                 FACILITY:  PHYSICIAN:  Jake Bathe, MD      DATE OF BIRTH:  1955/08/17  DATE OF CONSULTATION:  01/30/2011 DATE OF DISCHARGE:                                CONSULTATION   CARDIOLOGIST:  Armanda Magic, MD  REASON FOR CONSULTATION:  Evaluation of bradycardia at the request of Dr. Loreta Ave in the setting of colitis.  HISTORY OF PRESENT ILLNESS:  Karen [name] is a 55 year old female who has a history of recently diagnosed Crohn colitis, who was being monitored on the 2600 unit due to recent exacerbation and fever with abdominal pain.  She is being treated currently with steroids as well as immunosuppressants and she was placed on telemetry.  Yesterday evening while on telemetry, she had transient heart rates down into the low 30s at times which were asymptomatic.  Currently, she is in sinus bradycardia with a heart rate of 50.  She states that her normal baseline heart rate is quite "slow."  She is employed as a Engineer, civil (consulting) at [employer].  During these transient episodes of bradycardia, she does note that she has symptoms such as a wave of nausea, abdominal cramping, a warm feeling over her body, some diaphoresis, and her face can become flushed.  Typical symptoms for vagal phenomenon.  Thankfully, she has never had any syncope.  She denies any shortness of breath or anginal like symptoms or palpitations.  She does state that over the past 2 days, she has gained over 20 pounds mostly in fluid and it is manifesting itself as her lower extremities feeling heavy.  She was given fluids because of her, what they believe also was, adrenal insufficiency.  She has had periods of time where she needed IV steroids because she was vomiting so much she was unable to keep down her p.o. steroids.  In the nursing chart, at 1 a.m. last night, she did have one  entry where her heart rate dropped for 2 beats at 29 beats per minute with a blood pressure of 125/63.  She was asymptomatic.  PAST MEDICAL HISTORY: 1. Crohn disease recently diagnosed. 2. Fever. 3. Adrenal insufficiency. 4. Hypoalbuminemia. 5. Hypokalemia with potassium originally 2.5, now 3.3. 6. Urinary tract infection.  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  Denies any alcohol, tobacco, or illicit drug use.  She enjoys hiking and in May, for instance, she was able to hike [facility] without any difficulty.  FAMILY HISTORY:  No family history of Crohn disease, however, she states that her father died in his 47s from sudden cardiac death/myocardial infarction.  ALLERGIES:  SULFA.  MEDICATIONS:  Currently, she is on: 1. Hydrocortisone IV 25 mg q.6. 2. Ciprofloxacin 400 mg IV q.12.  She also has Zofran as a p.r.n.     agent.  She states that the Zofran has not helped her significantly     with her nausea and she was concerned that this may be potentiating     her bradycardia as  well.  We did discuss that this can cause     prolonged QT interval, but not necessarily bradycardia.  REVIEW OF SYSTEMS:  She has had recent rectal bleeding, fevers, but denies any syncope, palpitations, significant dyspnea on exertion or anginal like symptoms.  She is in good spirits, very pleasant. NEUROLOGIC:  Nonfocal without any recent issues.  Unless specified above, all other 12 review of systems are negative.  PHYSICAL EXAMINATION:  VITAL SIGNS:  Blood pressure currently in the 120s-150s systolic, heart rates ranging from the 40s-70s, sinus. Afebrile currently.  Respiration rate 16, sating 99% on room air. GENERAL:  Alert and oriented x3 in no acute distress, pleasant, lying flat in bed, comfortable. HEENT:  Eyes, well-perfused conjunctivae.  EOMI.  No scleral icterus. Her face does appear somewhat flushed, reddened cheeks are noted. NECK:  Supple.   No lymphadenopathy.  No thyromegaly.  No carotid bruits. No JVD. CARDIOVASCULAR:  Bradycardic, regular rhythm without any appreciable murmurs, rubs, or gallops.  Normal PMI. LUNGS:  Clear to auscultation bilaterally.  Normal respiratory effort. No wheezes, no rales. ABDOMEN:  Soft, nontender, normoactive bowel sounds.  No bruits.  Mildly overweight. EXTREMITIES:  2+ edema bilateral lower extremities.  Palpable distal pulses. SKIN:  Warm, dry, and intact. GU:  Deferred. RECTAL:  Deferred. NEUROLOGIC:  Cranial nerves II through XII grossly intact.  Nonfocal.  DATA:  Urine culture positive for E. coli.  Magnesium 1.8, potassium 3.3, creatinine 0.9.  White count 6.5, hemoglobin 8.7, platelets 229. Pro-BNP 833, elevated.  Blood cultures, no growth to date.  C. diff negative.  Ferritin 22.  TSH and free T4 currently pending.  Portable chest x-ray on January 29, 2011, showed no acute cardiopulmonary abnormality.  This was personally reviewed.  EKG personally reviewed from January 30, 2011, at 7 a.m. shows sinus bradycardia, rate 43 with sinus arrhythmia.  ASSESSMENT AND PLAN:  A 55 year old female with Crohn colitis, fever, urinary tract infection with bradycardia, transient into the 30s during episodes of abdominal discomfort. 1. Bradycardia - based upon history, I do believe that these are     vagally mediated phenomenon.  She has quite a classic prodrome     first involving abdominal discomfort, then a wave of nausea and     "warmth" that seemed to correlate with transient bradycardic     episodes.  For now, conservative management.  She is currently     being hydrated and we would advocate salt intake.  She has not had     any high-risk symptoms such as syncope.  I have coached her that if     she does have the prodrome while standing that she must sit down or     lay down for fear of syncopal episode.  This entity does not     require pacemaker.  Primary team has already gone  ahead and ordered     TSH and free T4 to ensure that she is not having any hypothyroidism     which could also be potentiating her bradycardia.  I have discussed     this fully with her and her daughter and they agree with the     current diagnosis and understand the etiology.  If any further     assistance is needed, please feel free to call.  I will relate to     Dr. Mayford Knife. 2. Crohn colitis - per Dr. Loreta Ave, currently on steroids. 3. Urinary tract infection - currently on antibiotics IV. 4. Hypokalemia -  please replete.  Electrolyte imbalances may also     precipitate bradycardia.  Zofran can prolong QT interval, but does     not necessarily cause bradycardia.  She may wish to avoid at this     time, however. 5. Hypomagnesemia, also repleting. 6. Fever - currently probably a combination of urinary infection with     Crohn colitis. 7. Anemia - per primary team as well as 7.7.     Jake Bathe, MD     MCS/MEDQ  D:  01/30/2011  T:  01/30/2011  Job:  [id number]  cc:   Armanda Magic, M.D. Anselmo Rod, MD, [facility]  Electronically Signed by Donato Schultz MD on 02/06/2011 06:52:29 AM

## 2011-02-10 ENCOUNTER — Inpatient Hospital Stay (HOSPITAL_COMMUNITY): Payer: 59

## 2011-02-10 ENCOUNTER — Other Ambulatory Visit (HOSPITAL_COMMUNITY): Payer: 59

## 2011-02-10 ENCOUNTER — Inpatient Hospital Stay (HOSPITAL_COMMUNITY)
Admission: AD | Admit: 2011-02-10 | Discharge: 2011-02-19 | DRG: 386 | Disposition: A | Discharge status: Home Health | Payer: 59 | Source: Ambulatory Visit | Attending: Gastroenterology | Admitting: Gastroenterology

## 2011-02-10 DIAGNOSIS — E860 Dehydration: Secondary | ICD-10-CM | POA: Diagnosis present

## 2011-02-10 DIAGNOSIS — D649 Anemia, unspecified: Secondary | ICD-10-CM | POA: Diagnosis present

## 2011-02-10 DIAGNOSIS — E46 Unspecified protein-calorie malnutrition: Secondary | ICD-10-CM | POA: Diagnosis present

## 2011-02-10 DIAGNOSIS — K5010 Crohn's disease of large intestine without complications: Principal | ICD-10-CM | POA: Diagnosis present

## 2011-02-10 LAB — CBC
MCH: 24.8 pg — ABNORMAL LOW (ref 26.0–34.0)
MCHC: 30.5 g/dL (ref 30.0–36.0)
Platelets: 291 10*3/uL (ref 150–400)
RDW: 18.9 % — ABNORMAL HIGH (ref 11.5–15.5)

## 2011-02-10 LAB — COMPREHENSIVE METABOLIC PANEL
ALT: 25 U/L (ref 0–35)
AST: 13 U/L (ref 0–37)
Albumin: 2.3 g/dL — ABNORMAL LOW (ref 3.5–5.2)
Calcium: 8.9 mg/dL (ref 8.4–10.5)
GFR calc Af Amer: >90 mL/min (ref >90)
Sodium: 137 mEq/L (ref 135–145)
Total Protein: 5.9 g/dL — ABNORMAL LOW (ref 6.0–8.3)

## 2011-02-11 LAB — COMPREHENSIVE METABOLIC PANEL
AST: 10 U/L (ref 0–37)
Albumin: 2 g/dL — ABNORMAL LOW (ref 3.5–5.2)
Alkaline Phosphatase: 93 U/L (ref 39–117)
BUN: 7 mg/dL (ref 6–23)
Creatinine, Ser: 0.52 mg/dL (ref 0.50–1.10)
Potassium: 3.2 mEq/L — ABNORMAL LOW (ref 3.5–5.1)
Total Protein: 5.1 g/dL — ABNORMAL LOW (ref 6.0–8.3)

## 2011-02-11 LAB — MAGNESIUM: Magnesium: 2.2 mg/dL (ref 1.5–2.5)

## 2011-02-11 LAB — DIFFERENTIAL
Basophils Relative: 0 % (ref 0–1)
Eosinophils Absolute: 0 10*3/uL (ref 0.0–0.7)
Eosinophils Relative: 0 % (ref 0–5)
Lymphocytes Relative: 11 % — ABNORMAL LOW (ref 12–46)
Monocytes Relative: 7 % (ref 3–12)
Neutrophils Relative %: 82 % — ABNORMAL HIGH (ref 43–77)

## 2011-02-11 LAB — CBC
Hemoglobin: 9 g/dL — ABNORMAL LOW (ref 12.0–15.0)
MCV: 80.3 fL (ref 78.0–100.0)
Platelets: 226 10*3/uL (ref 150–400)
RBC: 3.6 MIL/uL — ABNORMAL LOW (ref 3.87–5.11)
WBC: 5.8 10*3/uL (ref 4.0–10.5)

## 2011-02-11 LAB — PREALBUMIN: Prealbumin: 14 mg/dL — ABNORMAL LOW (ref 17.0–34.0)

## 2011-02-11 LAB — CHOLESTEROL, TOTAL: Cholesterol: 134 mg/dL (ref 0–200)

## 2011-02-11 LAB — GLUCOSE, CAPILLARY: Glucose-Capillary: 252 mg/dL — ABNORMAL HIGH (ref 70–99)

## 2011-02-11 LAB — PHOSPHORUS: Phosphorus: 3.5 mg/dL (ref 2.3–4.6)

## 2011-02-11 LAB — TRIGLYCERIDES: Triglycerides: 96 mg/dL (ref <150)

## 2011-02-12 LAB — COMPREHENSIVE METABOLIC PANEL
ALT: 27 U/L (ref 0–35)
Alkaline Phosphatase: 87 U/L (ref 39–117)
CO2: 31 mEq/L (ref 19–32)
GFR calc Af Amer: >90 mL/min (ref >90)
GFR calc non Af Amer: >90 mL/min (ref >90)
Glucose, Bld: 133 mg/dL — ABNORMAL HIGH (ref 70–99)
Potassium: 3.3 mEq/L — ABNORMAL LOW (ref 3.5–5.1)
Sodium: 139 mEq/L (ref 135–145)
Total Bilirubin: 0.2 mg/dL — ABNORMAL LOW (ref 0.3–1.2)

## 2011-02-13 LAB — GLUCOSE, CAPILLARY
Glucose-Capillary: 109 mg/dL — ABNORMAL HIGH (ref 70–99)
Glucose-Capillary: 113 mg/dL — ABNORMAL HIGH (ref 70–99)

## 2011-02-13 LAB — BASIC METABOLIC PANEL
CO2: 30 mEq/L (ref 19–32)
Calcium: 8.3 mg/dL — ABNORMAL LOW (ref 8.4–10.5)
Chloride: 102 mEq/L (ref 96–112)
Potassium: 3.6 mEq/L (ref 3.5–5.1)
Sodium: 137 mEq/L (ref 135–145)

## 2011-02-14 LAB — BASIC METABOLIC PANEL
BUN: 13 mg/dL (ref 6–23)
Chloride: 102 mEq/L (ref 96–112)
GFR calc Af Amer: >90 mL/min (ref >90)
Potassium: 3.9 mEq/L (ref 3.5–5.1)

## 2011-02-14 LAB — GLUCOSE, CAPILLARY
Glucose-Capillary: 114 mg/dL — ABNORMAL HIGH (ref 70–99)
Glucose-Capillary: 95 mg/dL (ref 70–99)

## 2011-02-15 ENCOUNTER — Inpatient Hospital Stay (HOSPITAL_COMMUNITY): Payer: 59

## 2011-02-15 LAB — BASIC METABOLIC PANEL
CO2: 29 mEq/L (ref 19–32)
Calcium: 8.6 mg/dL (ref 8.4–10.5)
Sodium: 136 mEq/L (ref 135–145)

## 2011-02-15 LAB — CBC
MCH: 25.1 pg — ABNORMAL LOW (ref 26.0–34.0)
MCHC: 31 g/dL (ref 30.0–36.0)
Platelets: 207 10*3/uL (ref 150–400)

## 2011-02-15 LAB — HEPATIC FUNCTION PANEL
Alkaline Phosphatase: 93 U/L (ref 39–117)
Total Protein: 5.2 g/dL — ABNORMAL LOW (ref 6.0–8.3)

## 2011-02-15 LAB — GLUCOSE, CAPILLARY
Glucose-Capillary: 104 mg/dL — ABNORMAL HIGH (ref 70–99)
Glucose-Capillary: 90 mg/dL (ref 70–99)

## 2011-02-15 LAB — CLOSTRIDIUM DIFFICILE BY PCR: Toxigenic C. Difficile by PCR: NEGATIVE

## 2011-02-16 ENCOUNTER — Encounter (HOSPITAL_COMMUNITY): Payer: 59

## 2011-02-16 ENCOUNTER — Ambulatory Visit (HOSPITAL_COMMUNITY): Payer: 59

## 2011-02-16 LAB — GLUCOSE, CAPILLARY

## 2011-02-16 LAB — CBC
HCT: 28.4 % — ABNORMAL LOW (ref 36.0–46.0)
Hemoglobin: 8.8 g/dL — ABNORMAL LOW (ref 12.0–15.0)
MCH: 25.3 pg — ABNORMAL LOW (ref 26.0–34.0)
MCHC: 31 g/dL (ref 30.0–36.0)
MCV: 81.6 fL (ref 78.0–100.0)

## 2011-02-16 LAB — DIFFERENTIAL
Basophils Relative: 1 % (ref 0–1)
Eosinophils Relative: 7 % — ABNORMAL HIGH (ref 0–5)
Lymphs Abs: 1.8 10*3/uL (ref 0.7–4.0)
Monocytes Absolute: 0.8 10*3/uL (ref 0.1–1.0)
Neutro Abs: 3.9 10*3/uL (ref 1.7–7.7)

## 2011-02-16 LAB — CHOLESTEROL, TOTAL: Cholesterol: 127 mg/dL (ref 0–200)

## 2011-02-16 LAB — URINE CULTURE: Culture  Setup Time: 201210072133

## 2011-02-16 LAB — COMPREHENSIVE METABOLIC PANEL
BUN: 14 mg/dL (ref 6–23)
Calcium: 8.3 mg/dL — ABNORMAL LOW (ref 8.4–10.5)
Creatinine, Ser: 0.64 mg/dL (ref 0.50–1.10)
GFR calc Af Amer: >90 mL/min (ref >90)
GFR calc non Af Amer: >90 mL/min (ref >90)
Glucose, Bld: 96 mg/dL (ref 70–99)
Total Protein: 5.1 g/dL — ABNORMAL LOW (ref 6.0–8.3)

## 2011-02-16 LAB — TRIGLYCERIDES: Triglycerides: 93 mg/dL (ref <150)

## 2011-02-16 LAB — MAGNESIUM: Magnesium: 2.2 mg/dL (ref 1.5–2.5)

## 2011-02-17 ENCOUNTER — Inpatient Hospital Stay (HOSPITAL_COMMUNITY): Payer: 59

## 2011-02-17 DIAGNOSIS — R509 Fever, unspecified: Secondary | ICD-10-CM

## 2011-02-17 LAB — URINALYSIS, ROUTINE W REFLEX MICROSCOPIC
Glucose, UA: NEGATIVE mg/dL
Ketones, ur: NEGATIVE mg/dL
Leukocytes, UA: NEGATIVE
Nitrite: NEGATIVE
Protein, ur: NEGATIVE mg/dL

## 2011-02-17 LAB — BASIC METABOLIC PANEL
CO2: 26 mEq/L (ref 19–32)
Glucose, Bld: 192 mg/dL — ABNORMAL HIGH (ref 70–99)
Potassium: 4.6 mEq/L (ref 3.5–5.1)
Sodium: 135 mEq/L (ref 135–145)

## 2011-02-17 LAB — GLUCOSE, CAPILLARY

## 2011-02-17 LAB — PROCALCITONIN: Procalcitonin: <0.1 ng/mL

## 2011-02-17 LAB — CBC
Hemoglobin: 8.7 g/dL — ABNORMAL LOW (ref 12.0–15.0)
RBC: 3.45 MIL/uL — ABNORMAL LOW (ref 3.87–5.11)

## 2011-02-18 ENCOUNTER — Inpatient Hospital Stay (HOSPITAL_COMMUNITY): Payer: 59

## 2011-02-18 LAB — CALCITONIN: Calcitonin: <2 pg/mL

## 2011-02-18 LAB — BASIC METABOLIC PANEL
BUN: 16 mg/dL (ref 6–23)
Chloride: 104 mEq/L (ref 96–112)
GFR calc Af Amer: >90 mL/min (ref >90)
Potassium: 4.4 mEq/L (ref 3.5–5.1)
Sodium: 138 mEq/L (ref 135–145)

## 2011-02-18 LAB — GLUCOSE, CAPILLARY
Glucose-Capillary: 155 mg/dL — ABNORMAL HIGH (ref 70–99)
Glucose-Capillary: 104 mg/dL — ABNORMAL HIGH (ref 70–99)
Glucose-Capillary: 191 mg/dL — ABNORMAL HIGH (ref 70–99)

## 2011-02-18 MED ORDER — GADOBENATE DIMEGLUMINE 529 MG/ML IV SOLN
18.0000 mL | Freq: Once | INTRAVENOUS | Status: AC
  Administered 2011-02-18: 18 mL via INTRAVENOUS

## 2011-02-19 LAB — CBC
HCT: 26.4 % — ABNORMAL LOW (ref 36.0–46.0)
Hemoglobin: 8.3 g/dL — ABNORMAL LOW (ref 12.0–15.0)
MCH: 25.3 pg — ABNORMAL LOW (ref 26.0–34.0)
MCHC: 31.4 g/dL (ref 30.0–36.0)
MCV: 80.5 fL (ref 78.0–100.0)
Platelets: 281 K/uL (ref 150–400)
RBC: 3.28 MIL/uL — ABNORMAL LOW (ref 3.87–5.11)
RDW: 19.4 % — ABNORMAL HIGH (ref 11.5–15.5)
WBC: 10.8 K/uL — ABNORMAL HIGH (ref 4.0–10.5)

## 2011-02-19 LAB — COMPREHENSIVE METABOLIC PANEL
AST: 14 U/L (ref 0–37)
Albumin: 2.1 g/dL — ABNORMAL LOW (ref 3.5–5.2)
BUN: 18 mg/dL (ref 6–23)
Calcium: 8.9 mg/dL (ref 8.4–10.5)
Creatinine, Ser: 0.53 mg/dL (ref 0.50–1.10)
Total Protein: 5.4 g/dL — ABNORMAL LOW (ref 6.0–8.3)

## 2011-02-19 LAB — HEPATIC FUNCTION PANEL
ALT: 58 U/L — ABNORMAL HIGH (ref 0–35)
AST: 16 U/L (ref 0–37)
Albumin: 2 g/dL — ABNORMAL LOW (ref 3.5–5.2)
Alkaline Phosphatase: 131 U/L — ABNORMAL HIGH (ref 39–117)
Bilirubin, Direct: <0.1 mg/dL (ref 0.0–0.3)
Total Bilirubin: 0.2 mg/dL — ABNORMAL LOW (ref 0.3–1.2)
Total Protein: 5.4 g/dL — ABNORMAL LOW (ref 6.0–8.3)

## 2011-02-19 LAB — MAGNESIUM: Magnesium: 2.4 mg/dL (ref 1.5–2.5)

## 2011-02-19 LAB — GLUCOSE, CAPILLARY
Glucose-Capillary: 125 mg/dL — ABNORMAL HIGH (ref 70–99)
Glucose-Capillary: 82 mg/dL (ref 70–99)

## 2011-02-19 LAB — PHOSPHORUS: Phosphorus: 4.2 mg/dL (ref 2.3–4.6)

## 2011-02-21 LAB — CULTURE, BLOOD (ROUTINE X 2)
Culture  Setup Time: 201210072133
Culture: NO GROWTH
Culture: NO GROWTH

## 2011-02-26 ENCOUNTER — Encounter (HOSPITAL_COMMUNITY): Payer: 59 | Attending: Gastroenterology

## 2011-02-26 DIAGNOSIS — K5090 Crohn's disease, unspecified, without complications: Secondary | ICD-10-CM | POA: Insufficient documentation

## 2011-02-26 NOTE — H&P (Signed)
NAME:  Karen [name], Karen [name] NO.:  [id number]  MEDICAL RECORD NO.:  [id number]  LOCATION:  5531                         FACILITY:  [facility]  PHYSICIAN:  Anselmo Rod, MD, FACGDATE OF BIRTH:  January 14, 1956  DATE OF ADMISSION:  01/27/2011 DATE OF DISCHARGE:                             HISTORY & PHYSICAL   REASON FOR ADMISSION:  Fever of 102.5 degrees Fahrenheit with severe bloody diarrhea in a 55 year old lady with Crohn's colitis diagnosed a year ago.  ASSESSMENT: 1. Worsening colitis with fever this morning over 102.5 degrees     Fahrenheit for the first time.  Question fever of unknown origin. 2. Severe anemia with hemoglobin down to 7.7 grams/dL on admission     today. 3. Fever of unknown origin.  Workup in progress. 4. Complex right renal cyst on CT on December 29, 2010. 5. Severe malnutrition with an albumin of 1.9 gm/dL today and a     prealbumin of 17 mg/dL on January 20, 2011. 6. Hypokalemia with a potassium of 2.5.  RECOMMENDATIONS: 1. Flexible sigmoidoscopy today. 2. N.p.o. for now. 3. CT scan of the abdomen with and without contrast to evaluate the     small bowel, colon and complex right renal cyst.  DISCUSSION:  Ms. Karen [name] is a very pleasant 33 year old white female who I first met in 2011 for bloody diarrhea.  The patient underwent a colonoscopy and was found to have patchy colitis at that time.  Biopsies showed Crohn's colitis and therefore she was treated with Lialda for 3-4 months.  In January of this year she ran out of her Lialda and did not think she needed to continue the mesalamine on an ongoing basis and therefore discontinued her medication.  She did well for 3-4 months after that, but her symptoms flared up starting early in the summer of 2012.  She was subsequently seen and evaluated.  She was treated with a prednisone taper and Lialda was restarted, but her symptoms did not improve.  At that time a TPMT level was done and  she was advised to start on Imuran which she got a prescription for but did not start for several weeks.  During this time her symptoms deteriorated when she was admitted to the hospital on August 30, 55 with severe colitis.  CT scan of the abdomen done at that time revealed thickening of the sigmoid colon with omental colitis.  She was treated with IV prednisone and Lialda was held as she felt she may be allergic to Lialda.  She was discharged from the hospital because we were not able to arrange for a  Remicade infusion while she was in the hospital.  The insurance company recommended that she should be discharged and Remicade to be given on an outpatient basis.  However, her symptoms worsened and she presented to the emergency room on January 14, 2011.  She was rehospitalized thereafter and was given Remicade infusion during this hospitalization along with an iron infusion.  She seemed to be doing well with complete resolution of her abdominal pain on the IV steroids that were restarted on her second admission and was discharged after 6 days of admission.  She was  seen by Dr. Avel Peace in consultation on September 7 in case she developed toxic megacolon.  After discharge the patient received her second dose of Remicade infusion on January 22, 2011.  However, the patient's symptoms got transiently better for 24- 48 hours after she got the Remicade only to recur shortly thereafter. She presented to the office for routine followup this morning and was febrile with a temperature of 102.5.  She denies any urinary symptoms or respiratory symptoms at this time.  She was slightly tachycardic and therefore plans were made to admit her to the hospital for further workup.  The patient claims she checks her temperature every night and her temperatures have ranged from 98-99 degrees throughout the last week prior to this admission.  The patient denies any history of fever, chills, rigors  today, but did have a chill last night.  She has continued to have bloody diarrhea with diffuse abdominal cramping and aching and gives a history of having up to 16 bowel movements per day that are small in volume and loose in consistency.  PAST MEDICAL HISTORY:  See list above.  ALLERGIES:  SULFA DRUGS.  MEDICATIONS AT HOME: 1. Imuran. 2. Remicade infusion to be scheduled in the next 4 weeks which has     been held for now. 3. Prednisone which has been held for now.  The patient was taking 40     mg of prednisone at this time.  SOCIAL HISTORY:  The patient is divorced.  She has four children who are very supportive of her and live around her.  She denies the use of alcohol, tobacco or drugs.  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  FAMILY HISTORY:  Noncontributory.  PHYSICAL EXAM:  GENERAL:  Patient is found to have stable vital signs with temperature of 98.7, blood pressure of 94/41, pulse of 85 per minute, respiratory rate of 20. HEENT:  Examination reveals atraumatic, normocephalic head.  Facial symmetry present.  Patient with dry mucosa. NECK:  Supple. CHEST:  Clear to auscultation.  S1, S2 regular. ABDOMEN:  Soft, diffusely tender with hyperactive bowel sounds.  No hepatosplenomegaly appreciated.  LABORATORY EVALUATION ON ADMISSION:  Revealed a sodium of 139, potassium 2.5, chloride 100, CO2 of 30, glucose 83, BUN 9, creatinine 0.79. Calcium is low at 7.9.  hepatic function panel revealed an indirect bili of 0.2, total bili of 0.3, AST of 8, ALT of 13, albumin of 1.9.  CBC revealed a white count of 6, hemoglobin of 7.7 with an MCV of 76.9 and platelets 246.  Urinalysis and blood cultures are pending.  CT scan of the abdomen and pelvis is pending.  Further recommendation to be made in followup.     Anselmo Rod, MD, [facility]     JNM/MEDQ  D:  01/27/2011  T:  01/27/2011  Job:   [id number]  cc:   Adolph Pollack, M.D.  Electronically Signed by Charna Elizabeth M.D. on 02/26/2011 01:13:00 PM

## 2011-03-04 NOTE — Consult Note (Signed)
NAME:  Karen, [name] NO.:  [id number]  MEDICAL RECORD NO.:  [id number]  LOCATION:  4506                         FACILITY:  [facility]  PHYSICIAN:  Gardiner Barefoot, MD    DATE OF BIRTH:  [birth year]  DATE OF CONSULTATION: DATE OF DISCHARGE:                                CONSULTATION   REFERRING PHYSICIAN:  Jyothi Elsie Amis, MD, FACG  REASON FOR CONSULTATION:  Fever.  HISTORY OF PRESENT ILLNESS:  This is a 55 year old female with a history of Crohn's colitis and on chronic steroid therapy and also recently on Remicade who was admitted at this time for bloody diarrhea.  She has had a very difficult to control course over time and has had close followup with GI.  During her hospitalization here, she began to develop fever that has been up to 101.2.  She otherwise feels well and has not had any significant complaints of chills or any different or worsening diarrhea. She does have abdominal pain, which has been continuous.  No recent significant exposures.  No upper respiratory symptoms.  No dysuria or pyuria.  No recent rashes.  PAST MEDICAL HISTORY:  Significant for Crohn's disease and on Remicade prednisone and Imuran.  MEDICATIONS:  Currently not on any antibiotics.  As an outpatient, she has again had been on steroids, Imuran, and Remicade.  ALLERGIES:  No known drug allergies.  FAMILY HISTORY:  Noncontributory.  SOCIAL HISTORY:  No history of alcohol, tobacco, or drug use.  REVIEW OF SYSTEMS:  A 12-point review of systems was obtained and is negative except as per the history of present illness.  PHYSICAL EXAMINATION:  VITAL SIGNS:  T-max is 101.5, T current is 98.2, pulse is 85, respirations 20, blood pressure is 100/60, and O2 sats 95% on room air. GENERAL:  The patient is awake, alert and oriented x3, and appears in no acute distress. HEENT:  Anicteric. NECK:  No cervical lymphadenopathy. CARDIOVASCULAR:  Regular rate and rhythm with no murmurs,  rubs, or gallops. LUNGS:  Clear to auscultation bilaterally. ABDOMEN:  Diffusely moderately tender, but soft and no rebound, and minimal voluntary guarding. EXTREMITIES:  No cyanosis, clubbing, or edema.  LABORATORY DATA:  Procalcitonin is less than 0.1, creatinine 0.47.  WBC is 5.7, hemoglobin 8.7, platelets 215.  Lactic acid is 1.2.  Urine culture is not significant for infection and there has been no UA. Blood cultures remain no growth to date.  Chest x-ray from February 10, 2011, no signs of consolidation.  ASSESSMENT/PLAN:  Fever of unclear source at this time.  I agree with the MRI of the abdomen, looking the kidneys as well as the GI system with concern for an occult abscess causing the fever.  Other sources certainly could be outlined due to her nutrition that she is getting via a PICC line.  Blood cultures though have remained negative.  If she does spike again, however, I will have the blood cultures repeated.  At this time, we will hold on starting antibiotics pending identification of any source.  Thanks for the consult.  We will follow along with you.     Gardiner Barefoot, MD     RWC/MEDQ  D:  02/17/2011  T:  02/17/2011  Job:  [id number]  Electronically Signed by Staci Righter MD on 03/04/2011 04:44:02 PM

## 2011-04-21 ENCOUNTER — Other Ambulatory Visit: Payer: Self-pay | Admitting: Gastroenterology

## 2011-04-23 ENCOUNTER — Encounter (HOSPITAL_COMMUNITY)
Admission: RE | Admit: 2011-04-23 | Discharge: 2011-04-23 | Disposition: A | Discharge status: Home / Self Care | Payer: 59 | Source: Ambulatory Visit | Attending: Gastroenterology | Admitting: Gastroenterology

## 2011-04-23 ENCOUNTER — Other Ambulatory Visit: Payer: Self-pay | Admitting: Gastroenterology

## 2011-04-23 DIAGNOSIS — K5090 Crohn's disease, unspecified, without complications: Secondary | ICD-10-CM | POA: Insufficient documentation

## 2011-04-23 MED ORDER — SODIUM CHLORIDE 0.9 % IV SOLN
5.0000 mg/kg | INTRAVENOUS | Status: DC
  Administered 2011-04-23: 400 mg via INTRAVENOUS
  Filled 2011-04-23: qty 40

## 2011-05-01 ENCOUNTER — Other Ambulatory Visit: Payer: Self-pay | Admitting: Gastroenterology

## 2011-05-01 ENCOUNTER — Encounter: Payer: Self-pay | Admitting: General Surgery

## 2011-05-12 HISTORY — PX: OTHER SURGICAL HISTORY: SHX169

## 2011-06-25 ENCOUNTER — Encounter (HOSPITAL_COMMUNITY)
Admission: RE | Admit: 2011-06-25 | Discharge: 2011-06-25 | Disposition: A | Discharge status: Home / Self Care | Payer: 59 | Source: Ambulatory Visit | Attending: Gastroenterology | Admitting: Gastroenterology

## 2011-06-25 DIAGNOSIS — K5090 Crohn's disease, unspecified, without complications: Secondary | ICD-10-CM | POA: Insufficient documentation

## 2011-06-25 MED ORDER — SODIUM CHLORIDE 0.9 % IV SOLN
5.0000 mg/kg | INTRAVENOUS | Status: DC
  Administered 2011-06-25: 400 mg via INTRAVENOUS
  Filled 2011-06-25: qty 40

## 2011-07-06 ENCOUNTER — Other Ambulatory Visit: Payer: Self-pay | Admitting: Gastroenterology

## 2011-07-06 MED ORDER — INFLIXIMAB 100 MG IV SOLR: 5.0000 mg/kg | INTRAVENOUS | Status: DC

## 2011-08-14 ENCOUNTER — Encounter (HOSPITAL_COMMUNITY)
Admission: RE | Admit: 2011-08-14 | Discharge: 2011-08-14 | Disposition: A | Discharge status: Home / Self Care | Payer: 59 | Source: Ambulatory Visit | Attending: Gastroenterology | Admitting: Gastroenterology

## 2011-08-14 DIAGNOSIS — K5090 Crohn's disease, unspecified, without complications: Secondary | ICD-10-CM | POA: Insufficient documentation

## 2011-08-14 MED ORDER — SODIUM CHLORIDE 0.9 % IV SOLN: 5.0000 mg/kg | INTRAVENOUS | Status: DC

## 2011-08-14 MED ORDER — SODIUM CHLORIDE 0.9 % IV SOLN
5.0000 mg/kg | INTRAVENOUS | Status: DC
  Administered 2011-08-14: 400 mg via INTRAVENOUS
  Filled 2011-08-14: qty 40

## 2011-08-20 ENCOUNTER — Encounter (HOSPITAL_COMMUNITY): Payer: 59

## 2011-08-26 ENCOUNTER — Other Ambulatory Visit: Payer: Self-pay | Admitting: Gastroenterology

## 2011-08-26 MED ORDER — INFLIXIMAB 100 MG IV SOLR: 10.0000 mg/kg | INTRAVENOUS | Status: DC

## 2011-09-04 ENCOUNTER — Encounter (HOSPITAL_COMMUNITY)
Admission: RE | Admit: 2011-09-04 | Discharge: 2011-09-04 | Disposition: A | Discharge status: Home / Self Care | Payer: 59 | Source: Ambulatory Visit | Attending: Gastroenterology | Admitting: Gastroenterology

## 2011-09-04 ENCOUNTER — Telehealth: Payer: Self-pay | Admitting: Gastroenterology

## 2011-09-04 ENCOUNTER — Other Ambulatory Visit: Payer: Self-pay | Admitting: Gastroenterology

## 2011-09-04 MED ORDER — ACETAMINOPHEN 325 MG PO TABS
650.0000 mg | ORAL_TABLET | Freq: Once | ORAL | Status: AC
  Administered 2011-09-04: 650 mg via ORAL
  Filled 2011-09-04: qty 2

## 2011-09-04 MED ORDER — SODIUM CHLORIDE 0.9 % IV SOLN
5.0000 mg/kg | INTRAVENOUS | Status: DC
  Administered 2011-09-04: 400 mg via INTRAVENOUS
  Filled 2011-09-04: qty 40

## 2011-09-04 MED ORDER — DIPHENHYDRAMINE HCL 25 MG PO TABS
50.0000 mg | ORAL_TABLET | Freq: Once | ORAL | Status: AC
  Administered 2011-09-04: 50 mg via ORAL
  Filled 2011-09-04 (×2): qty 2

## 2011-09-04 MED ORDER — METHYLPREDNISOLONE SODIUM SUCC 125 MG IJ SOLR
125.0000 mg | Freq: Once | INTRAMUSCULAR | Status: AC
  Administered 2011-09-04: 125 mg via INTRAVENOUS
  Filled 2011-09-04: qty 2

## 2011-09-04 NOTE — Progress Notes (Addendum)
1100 patient called for help, complains of inability to breathe and flushed.  Alert and oriented, face flushed and anxious.  Dr. Loreta Ave advised.   1110 states is now fine with no difficulty breathing.  MD states she will phone back with further instructions. 1145 after 2.5 cc infused when remicade restarted, patient began to tremble and shake.  Infusion again stopped and MD notified.  Will medicate per order.  Patient states she is feeling better.

## 2011-09-16 ENCOUNTER — Encounter (HOSPITAL_COMMUNITY): Payer: Self-pay | Admitting: *Deleted

## 2011-09-16 ENCOUNTER — Observation Stay (HOSPITAL_COMMUNITY)
Admission: AD | Admit: 2011-09-16 | Discharge: 2011-09-17 | Disposition: A | Discharge status: Home / Self Care | Payer: 59 | Source: Ambulatory Visit | Attending: Gastroenterology | Admitting: Gastroenterology

## 2011-09-16 ENCOUNTER — Other Ambulatory Visit: Payer: Self-pay | Admitting: Gastroenterology

## 2011-09-16 DIAGNOSIS — R42 Dizziness and giddiness: Secondary | ICD-10-CM | POA: Insufficient documentation

## 2011-09-16 DIAGNOSIS — R112 Nausea with vomiting, unspecified: Secondary | ICD-10-CM | POA: Insufficient documentation

## 2011-09-16 DIAGNOSIS — R5381 Other malaise: Secondary | ICD-10-CM | POA: Insufficient documentation

## 2011-09-16 DIAGNOSIS — R5383 Other fatigue: Secondary | ICD-10-CM | POA: Insufficient documentation

## 2011-09-16 DIAGNOSIS — H538 Other visual disturbances: Secondary | ICD-10-CM | POA: Insufficient documentation

## 2011-09-16 DIAGNOSIS — D509 Iron deficiency anemia, unspecified: Secondary | ICD-10-CM | POA: Insufficient documentation

## 2011-09-16 DIAGNOSIS — K5289 Other specified noninfective gastroenteritis and colitis: Principal | ICD-10-CM | POA: Insufficient documentation

## 2011-09-16 DIAGNOSIS — E46 Unspecified protein-calorie malnutrition: Secondary | ICD-10-CM | POA: Insufficient documentation

## 2011-09-16 LAB — CBC
HCT: 27.1 % — ABNORMAL LOW (ref 36.0–46.0)
Hemoglobin: 8.2 g/dL — ABNORMAL LOW (ref 12.0–15.0)
MCV: 75.5 fL — ABNORMAL LOW (ref 78.0–100.0)
RBC: 3.59 MIL/uL — ABNORMAL LOW (ref 3.87–5.11)
WBC: 11.9 10*3/uL — ABNORMAL HIGH (ref 4.0–10.5)

## 2011-09-16 LAB — COMPREHENSIVE METABOLIC PANEL
Albumin: 2.5 g/dL — ABNORMAL LOW (ref 3.5–5.2)
Alkaline Phosphatase: 89 U/L (ref 39–117)
BUN: 13 mg/dL (ref 6–23)
CO2: 29 mEq/L (ref 19–32)
Chloride: 104 mEq/L (ref 96–112)
GFR calc non Af Amer: 70 mL/min — ABNORMAL LOW (ref >90)
Glucose, Bld: 71 mg/dL (ref 70–99)
Potassium: 3.7 mEq/L (ref 3.5–5.1)
Total Bilirubin: 0.3 mg/dL (ref 0.3–1.2)

## 2011-09-16 MED ORDER — ONDANSETRON HCL 4 MG/2ML IJ SOLN
4.0000 mg | Freq: Four times a day (QID) | INTRAMUSCULAR | Status: DC
  Filled 2011-09-16 (×2): qty 2

## 2011-09-16 MED ORDER — METHYLPREDNISOLONE SODIUM SUCC 125 MG IJ SOLR
125.0000 mg | Freq: Two times a day (BID) | INTRAMUSCULAR | Status: DC
  Administered 2011-09-16 – 2011-09-17 (×2): 125 mg via INTRAVENOUS
  Filled 2011-09-16 (×2): qty 2

## 2011-09-16 MED ORDER — SODIUM CHLORIDE 0.9 % IV SOLN
500.0000 mg | Freq: Two times a day (BID) | INTRAVENOUS | Status: AC
  Administered 2011-09-16 – 2011-09-17 (×2): 500 mg via INTRAVENOUS
  Filled 2011-09-16 (×3): qty 25

## 2011-09-16 MED ORDER — SODIUM CHLORIDE 0.9 % IV SOLN
Freq: Once | INTRAVENOUS | Status: AC
  Administered 2011-09-17: 1000 mL via INTRAVENOUS

## 2011-09-16 MED ORDER — SODIUM CHLORIDE 0.9 % IV SOLN
Freq: Once | INTRAVENOUS | Status: AC
  Administered 2011-09-16: 13:00:00 via INTRAVENOUS

## 2011-09-16 NOTE — Consult Note (Signed)
Reason for Consult: Severe diarrhea/ Worsening Crohn's colitis. Referring Physician: [name] is an 56 y.o. female.  HPI: Karen [name] was diagnosed with Crohn's colitis in September 2011. She did well on Lialda till April of 2012, when she decided to stop taking the Lialda on her own. She was well for several months till her symptoms flared up again and she was advised to try an immunomodulator, in the summer of 2012. She felt she was allergic to the Imuran and stopped taking it after a few doses. She was given her Ist Remicade infusion of 5 mg /kg on 01/15/11 and her second one a week later. Thereafter, she was receiving an infusion very 8 weeks. She was stable till February 2013, when her symptoms flared up again. She was given a steroid taper and we tried to give her a higher dose of Remicade [on 09/04/11] and she had hypotension and chills during that infusion and therefore it was stopped. She has been on a steroid taper and was seen emergently yesterday for severe diarrhea and dehydration, when she was noted to have an anal fissure and severe proctitis on anoscopy. I advised her to come to the hospital yesterday but she refused. When I called her with her labs this morning she agreed to come in for IV hydration and possibly an Iron infusion. She does not want to try another biologic till she is seen and evaluated at [facility]. I have spoken to Dr. Kenard Gower and Dr. Redgie Grayer over the phone yesterday. She has an appointment at [facility] on Monday 09/21/11. She is currently averaging 10-12 bloody mucoid BM's per day with diffuse abdominal pain and nausea with vomiting intermittently. Karen [name] has received 3 L of 0.9% NaCl today and seems to be feeling much better.     Past Medical History  Diagnosis Date  . Pneumonia     Past Surgical History  Procedure Date  . Tonsillectomy    History reviewed. No pertinent family history.  Social History:  reports that she has never  smoked. She has never used smokeless tobacco. She reports that she does not drink alcohol or use illicit drugs.  Allergies:  Allergies  Allergen Reactions  . Lialda (Mesalamine) Swelling  . Remicade (Infliximab) ?Itching  . Sulfa Antibiotics Anaphylaxis  . Imuran (Azathioprine) Other (See Comments)    Fever, throat swelling   Medications: I have reviewed the patient's current medications.  Results for orders placed during the hospital encounter of 09/16/11 (from the past 48 hour(s))  CBC     Status: Abnormal   Collection Time   09/16/11  1:19 PM      Component Value Range Comment   WBC 11.9 (*) 4.0 - 10.5 (K/uL)    RBC 3.59 (*) 3.87 - 5.11 (MIL/uL)    Hemoglobin 8.2 (*) 12.0 - 15.0 (g/dL)    HCT 47.8 (*) 29.5 - 46.0 (%)    MCV 75.5 (*) 78.0 - 100.0 (fL)    MCH 22.8 (*) 26.0 - 34.0 (pg)    MCHC 30.3  30.0 - 36.0 (g/dL)    RDW 62.1 (*) 30.8 - 15.5 (%)    Platelets 324  150 - 400 (K/uL)   COMPREHENSIVE METABOLIC PANEL     Status: Abnormal   Collection Time   09/16/11  1:19 PM      Component Value Range Comment   Sodium 142  135 - 145 (mEq/L)    Potassium 3.7  3.5 - 5.1 (mEq/L)  Chloride 104  96 - 112 (mEq/L)    CO2 29  19 - 32 (mEq/L)    Glucose, Bld 71  70 - 99 (mg/dL)    BUN 13  6 - 23 (mg/dL)    Creatinine, Ser 4.69  0.50 - 1.10 (mg/dL)    Calcium 8.4  8.4 - 10.5 (mg/dL)    Total Protein 5.4 (*) 6.0 - 8.3 (g/dL)    Albumin 2.5 (*) 3.5 - 5.2 (g/dL)    AST 10  0 - 37 (U/L)    ALT 10  0 - 35 (U/L)    Alkaline Phosphatase 89  39 - 117 (U/L)    Total Bilirubin 0.3  0.3 - 1.2 (mg/dL)    GFR calc non Af Amer 70 (*) >90 (mL/min)    GFR calc Af Amer 81 (*) >90 (mL/min)     No results found.  Review of Systems  Constitutional: Positive for malaise/fatigue. Negative for fever, chills, weight loss and diaphoresis.  HENT: Negative.   Eyes: Positive for blurred vision.  Respiratory: Negative.   Cardiovascular: Negative.   Gastrointestinal: Positive for nausea, vomiting,  abdominal pain, diarrhea and blood in stool. Negative for heartburn and melena.  Genitourinary: Negative.   Musculoskeletal: Positive for myalgias and joint pain.  Skin: Negative for itching and rash.  Neurological: Positive for dizziness and weakness. Negative for tingling, tremors, sensory change, speech change and focal weakness.  Endo/Heme/Allergies: Negative.   Psychiatric/Behavioral: Negative for depression, suicidal ideas, memory loss and substance abuse. The patient is nervous/anxious. The patient does not have insomnia.    Blood pressure 100/53, pulse 92, temperature 99.1 F (37.3 C), temperature source Oral, resp. rate 20, height 5\' 6"  (1.676 m), weight 86.3 kg (190 lb 4.1 oz), SpO2 100.00%. Physical Exam  Constitutional: She is oriented to person, place, and time. She appears well-developed and well-nourished.  HENT:  Head: Normocephalic and atraumatic.  Eyes: EOM are normal. Pupils are equal, round, and reactive to light.  Neck: Normal range of motion. Neck supple. No JVD present. No tracheal deviation present. No thyromegaly present.  Cardiovascular: Normal rate and regular rhythm.   Respiratory: Effort normal and breath sounds normal. No respiratory distress. She has no wheezes. She has no rales. She exhibits no tenderness.  GI: Soft. Bowel sounds are normal. She exhibits no distension and no mass. There is tenderness. There is guarding. There is no rebound.  Musculoskeletal: Normal range of motion.  Lymphadenopathy:    She has no cervical adenopathy.  Neurological: She is alert and oriented to person, place, and time. She has normal reflexes.  Skin: Skin is warm and dry.  Psychiatric: She has a normal mood and affect. Her behavior is normal. Judgment and thought content normal.    Assessment/Plan: 1) Severe Crohn's colitis: Will give patient Solumedrol 125 mg IV night and another dose prior to discharge in the morning.  2) Iron deficiency anemia: Will give Venofer 500 mg  IV x 2 doses as advised by pharmacy.   3) Malnutrition. 4) Severe dehydration: Will give another 3 L of normal saline prior to discharge tomorrow.  Karen [name] 09/16/2011, 5:15 PM

## 2011-09-16 NOTE — Progress Notes (Signed)
Call placed to Dr. Loreta Ave, informed her patient was admitted to room 6706, new order received.

## 2011-09-16 NOTE — Progress Notes (Signed)
Call placed to IV team, attempt x 1 for saline lock unsuccessful. Request saline lock insertion for iv fluids administration.

## 2011-09-17 LAB — CBC
HCT: 29.7 % — ABNORMAL LOW (ref 36.0–46.0)
MCHC: 30.6 g/dL (ref 30.0–36.0)
Platelets: 392 10*3/uL (ref 150–400)
RDW: 16.9 % — ABNORMAL HIGH (ref 11.5–15.5)

## 2011-09-17 LAB — CLOSTRIDIUM DIFFICILE BY PCR: Toxigenic C. Difficile by PCR: NEGATIVE

## 2011-09-17 NOTE — Discharge Summary (Signed)
Physician Discharge Summary  Patient ID: Karen [name] MRN: [id number] DOB/AGE: 1955-06-28 56 y.o.  Admit date: 09/16/2011 Discharge date: 09/17/2011  Admission Diagnoses: 1) Severe Crohn's colitis  2) Severe iron deficiency anemia.  3) Malnutrition.  Discharge Diagnoses:  Same as above.  Discharged Condition: fair  Hospital Course: This was a 24 hour observation for IV hydration and IV iron infusion  Consults: None  Significant Diagnostic Studies: labs: CBC, Stool studies PCR.  Treatments: Solumedrol with Venofer infusion. Discharge Exam: Blood pressure 120/72, pulse 86, temperature 98.8 F (37.1 C), temperature source Oral, resp. rate 18, height 5\' 6"  (1.676 m), weight 86.864 kg (191 lb 8 oz), SpO2 96.00%. General appearance: alert, cooperative, fatigued and mild distress Head: Normocephalic, without obvious abnormality, atraumatic Eyes: negative, conjunctivae/corneas clear. PERRL, EOM's intact. Fundi benign. Neck: thyroid not enlarged, symmetric, no tenderness/mass/nodules Resp: clear to auscultation bilaterally Cardio: regular rate and rhythm, S1, S2 normal, no murmur, click, rub or gallop GI: soft, mild diffuse tenderness with NABS; no masses,  no organomegaly  Disposition: 06-Home-Health Care Svc   Medication List  As of 09/17/2011  6:17 PM   ASK your doctor about these medications         predniSONE 10 MG tablet   Commonly known as: DELTASONE   Take 50 mg by mouth daily. Tapering dose started on 09/05/2011. Currently taking 50 mg until Saturday 09/19/2011             Signed: Charna Elizabeth 09/17/2011, 6:17 PM

## 2011-09-17 NOTE — Progress Notes (Signed)
Met with patient to introduce CM and discharge planning. She has an appointment scheduled at [facility] with a GI specialist  09/21/2011 to evaluate her Crohn's disease and treatment options. Will continue to follow for discharge needs.

## 2011-09-17 NOTE — Progress Notes (Signed)
Utilization review completed. Avleen Bordwell RN, CCM  

## 2011-10-09 ENCOUNTER — Encounter (HOSPITAL_COMMUNITY): Payer: 59

## 2012-01-29 ENCOUNTER — Emergency Department (HOSPITAL_COMMUNITY)
Admission: EM | Admit: 2012-01-29 | Discharge: 2012-01-29 | Disposition: A | Discharge status: Home / Self Care | Payer: 59 | Attending: Emergency Medicine | Admitting: Emergency Medicine

## 2012-01-29 ENCOUNTER — Emergency Department (HOSPITAL_COMMUNITY): Payer: 59

## 2012-01-29 ENCOUNTER — Encounter (HOSPITAL_COMMUNITY): Payer: Self-pay | Admitting: *Deleted

## 2012-01-29 DIAGNOSIS — R000 Tachycardia, unspecified: Secondary | ICD-10-CM | POA: Insufficient documentation

## 2012-01-29 DIAGNOSIS — K5090 Crohn's disease, unspecified, without complications: Secondary | ICD-10-CM | POA: Insufficient documentation

## 2012-01-29 DIAGNOSIS — R0602 Shortness of breath: Secondary | ICD-10-CM | POA: Insufficient documentation

## 2012-01-29 DIAGNOSIS — T7840XA Allergy, unspecified, initial encounter: Secondary | ICD-10-CM

## 2012-01-29 DIAGNOSIS — R21 Rash and other nonspecific skin eruption: Secondary | ICD-10-CM | POA: Insufficient documentation

## 2012-01-29 DIAGNOSIS — R109 Unspecified abdominal pain: Secondary | ICD-10-CM | POA: Insufficient documentation

## 2012-01-29 HISTORY — DX: Crohn's disease, unspecified, without complications: K50.90

## 2012-01-29 LAB — BASIC METABOLIC PANEL
BUN: 14 mg/dL (ref 6–23)
Glucose, Bld: 94 mg/dL (ref 70–99)

## 2012-01-29 LAB — CBC
MCH: 25.1 pg — ABNORMAL LOW (ref 26.0–34.0)
MCHC: 31.9 g/dL (ref 30.0–36.0)
MCV: 78.9 fL (ref 78.0–100.0)
Platelets: 356 10*3/uL (ref 150–400)
RDW: 14.1 % (ref 11.5–15.5)

## 2012-01-29 MED ORDER — HYDROCORTISONE SOD SUCCINATE 100 MG IJ SOLR
200.0000 mg | Freq: Once | INTRAMUSCULAR | Status: AC
  Administered 2012-01-29: 200 mg via INTRAVENOUS
  Filled 2012-01-29: qty 4
  Filled 2012-01-29: qty 2

## 2012-01-29 MED ORDER — SODIUM CHLORIDE 0.9 % IV SOLN: 1000.0000 mL | Freq: Once | INTRAVENOUS | Status: DC

## 2012-01-29 MED ORDER — EPINEPHRINE 0.3 MG/0.3ML IJ DEVI: 0.3000 mg | INTRAMUSCULAR | Status: DC | PRN

## 2012-01-29 MED ORDER — IOHEXOL 350 MG/ML SOLN
100.0000 mL | Freq: Once | INTRAVENOUS | Status: AC | PRN
  Administered 2012-01-29: 70 mL via INTRAVENOUS

## 2012-01-29 MED ORDER — DIPHENHYDRAMINE HCL 50 MG/ML IJ SOLN
50.0000 mg | Freq: Once | INTRAMUSCULAR | Status: AC
  Administered 2012-01-29: 50 mg via INTRAVENOUS
  Filled 2012-01-29: qty 1

## 2012-01-29 MED ORDER — FAMOTIDINE 20 MG PO TABS
40.0000 mg | ORAL_TABLET | Freq: Once | ORAL | Status: AC
  Administered 2012-01-29: 40 mg via ORAL
  Filled 2012-01-29 (×2): qty 1

## 2012-01-29 MED ORDER — HYDROXYZINE HCL 25 MG PO TABS
25.0000 mg | ORAL_TABLET | Freq: Once | ORAL | Status: AC
  Administered 2012-01-29: 25 mg via ORAL
  Filled 2012-01-29: qty 1

## 2012-01-29 NOTE — ED Notes (Signed)
Pt reports having a CT performed yesterday and started developing a rash and SOB last night. States that she has taken oral benadryl 50mg  approximately 2 hrs prior to arrival to the ER.  Pt has generalized rash to body and SOB. O2 sats 100% at this time.

## 2012-01-29 NOTE — ED Notes (Signed)
Pt. Has went to xray unable to get EKG at this time.

## 2012-01-29 NOTE — ED Provider Notes (Addendum)
History     CSN: [id number]  Arrival date & time 01/29/12  1414   First MD Initiated Contact with Patient 01/29/12 1444      Chief Complaint  Patient presents with  . 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  Patient reports that she is currently being followed by Dr. Byrd Hesselbach and received a CT with contrast yesterday to check on a currently draining abdominal abscess.  It was decided at that time that the patient can continue to be followed up as an outpatient and that there was no fistula or concern with patient's abscess.  Last evening the pt states that she developed erythematous rash all over her body that is pruritic in nature. She called her PCP who advised her to come to the ER. Pt has taken benadryl and denies throat closing, tongue swelling, stridor, wheezing, SOB, DOE, CP, abdominal pain fevers, night sweats,  Chills, cough, hemoptysis, calf pain or leg swelling. There has been no use of new medications or products. Pt denies previous reaction to contrast dye.   Patient is a 56 y.o. female presenting with allergic reaction. The history is provided by the patient.  Allergic Reaction The primary symptoms are  abdominal pain. The primary symptoms do not include wheezing, shortness of breath or cough.    Past Medical History  Diagnosis Date  . Pneumonia   . Crohn's disease     Past Surgical History  Procedure Date  . Tonsillectomy   . Abdominoperineal proctocolectomy     No family history on file.  History  Substance Use Topics  . Smoking status: Never Smoker   . Smokeless tobacco: Never Used  . Alcohol Use: No    OB History    Grav Para Term Preterm Abortions TAB SAB Ect Mult Living                  Review of  Systems  Respiratory: Negative for cough, shortness of breath and wheezing.   Gastrointestinal: Positive for abdominal pain.    Allergies  Lialda; Remicade; Sulfa antibiotics; Imuran; and Ivp dye  Home Medications  No current outpatient prescriptions on file.  BP 112/77  Pulse 103  Temp 97.9 F (36.6 C) (Oral)  Resp 22  SpO2 99%  Physical Exam  Nursing note and vitals reviewed. Constitutional: She is oriented to person, place, and time. She appears well-developed and well-nourished. No distress.  HENT:  Head: Normocephalic and atraumatic.  Mouth/Throat: Oropharynx is clear and moist and mucous membranes are normal.       No sign of airway obstruction. No edema of face, eyelids, lips, tongue, uvula.Marland Kitchen Uvula midline, no nasal congestion or drooling.  Tongue not elevated. No trismus.  Neck: Trachea normal, normal range of motion and full passive range of motion without pain. Neck supple. Carotid bruit is not present. No tracheal deviation present.       No carotid bruits or stridor  Cardiovascular: Regular rhythm, intact distal pulses and normal pulses.        Tachycardic  Pulmonary/Chest: Effort normal. No stridor.  Musculoskeletal: Normal range of motion.  Neurological: She is alert and oriented to person, place, and time.  Skin: Skin is warm and intact. No rash noted. Rash is not urticarial. She is not diaphoretic.       Not diaphoretic. No blanchable urticaria,  no petechiae or purpura.   Psychiatric: She has a normal mood and affect. Her behavior is normal.    ED Course  Procedures (including critical care time)  Labs Reviewed  D-DIMER, QUANTITATIVE - Abnormal; Notable for the following:    D-Dimer, Quant 1.77 (*)     All other components within normal limits  CBC - Abnormal; Notable for the following:    WBC 13.9 (*)     RBC 5.25 (*)     MCH 25.1 (*)     All other components within normal limits  BASIC METABOLIC PANEL - Abnormal; Notable for the following:    CO2 17  (*)     Creatinine, Ser 1.35 (*)     GFR calc non Af Amer 43 (*)     GFR calc Af Amer 50 (*)     All other components within normal limits   Dg Chest 2 View  01/29/2012  *RADIOLOGY REPORT*  Clinical Data: Allergic reaction to intravenous iodinated contrast yesterday.  Shortness of breath on exertion today.  CHEST - 2 VIEW  Comparison: Portable chest x-ray 02/10/1011, 01/29/2011, and two- view chest x-ray 01/27/2011.  Findings: Cardiomediastinal silhouette unremarkable, unchanged. Lungs clear.  Bronchovascular markings normal.  Pulmonary vascularity normal.  No pneumothorax.  No pleural effusions. Minimal degenerative changes involving the thoracic spine.  No significant interval change.  IMPRESSION: No acute cardiopulmonary disease.  Stable examination.   Original Report Authenticated By: Arnell Sieving, M.D.    Ct Angio Chest Pe W/cm &/or Wo Cm  01/29/2012  *RADIOLOGY REPORT*  Clinical Data: SOB, tachycardia, evaluate for PE  CT ANGIOGRAPHY CHEST  Technique:  Multidetector CT imaging of the chest using the standard protocol during bolus administration of intravenous contrast. Multiplanar reconstructed images including MIPs were obtained and reviewed to evaluate the vascular anatomy.  Contrast: 70mL OMNIPAQUE IOHEXOL 350 MG/ML SOLN  Comparison: Chest radiograph dated 01/29/2012  Findings: No evidence of pulmonary embolism.  The lungs are clear.  No pulmonary nodules. No pleural effusion or pneumothorax.  Visualized thyroid is mildly heterogeneous.  The heart is normal in size.  No pericardial effusion.  Coronary atherosclerosis.  No suspicious mediastinal, hilar, or axillary lymphadenopathy.  Mild degenerative changes of the thoracic spine.  Sagittal images of the sternum reflect motion degradation.  IMPRESSION: No evidence of pulmonary embolism.  No evidence of acute cardiopulmonary disease.   Original Report Authenticated By: Charline Bills, M.D.     Date: 01/29/2012  Rate: 106  Rhythm: normal  sinus rhythm  QRS Axis: normal  Intervals: normal  ST/T Wave abnormalities: normal  Conduction Disutrbances:none  Narrative Interpretation:   Old EKG Reviewed: unchanged     No diagnosis found.  4:23 PM Pt ambulated in ER becoming tachycardic and SOB. Will order ddimer to r/o PE.   5:00 PM Positive dimer, contacted radiology about possible allergy to dye, consult pending  5:32 PM Hydrocortisone and benadryl ordered.    MDM  Allergic reaction Tachycardia  Patient re-evaluated prior to dc, is hemodynamically stable, in no respiratory distress, and denies the feeling of throat closing. Pt has been advised to take OTC benadryl & return to the ED if they have a mod-severe allergic rxn (s/s including throat closing, difficulty breathing, swelling of lips face or tongue).Pt given epi pen & instruction use incase of anaphylactic reaction. Pt is to follow up with their PCP. Pt is agreeable with plan & verbalizes understanding. Pt evaluated for PE as well and CTangio chest is without PE.  Jaci Carrel, [place] 01/29/12 1929  Jaci Carrel, PA-C 01/29/12 1952  Jaci Carrel, PA-C 02/25/12 0111

## 2012-01-29 NOTE — ED Notes (Signed)
Patient transported to CT 

## 2012-02-01 NOTE — ED Provider Notes (Signed)
History/physical exam/procedure(s) were performed by non-physician practitioner and as supervising physician I was immediately available for consultation/collaboration. I have reviewed all notes and am in agreement with care and plan.   Hilario Quarry, MD 02/01/12 1455

## 2012-03-11 NOTE — ED Provider Notes (Signed)
History/physical exam/procedure(s) were performed by non-physician practitioner and as supervising physician I was immediately available for consultation/collaboration. I have reviewed all notes and am in agreement with care and plan.   Gloris Shiroma S Ly Wass, MD 03/11/12 0005 

## 2013-01-24 IMAGING — CR DG ABDOMEN 2V
2 series · 2 of 2 positions shown · non-contrast
Comparison: CT abdomen pelvis 12/29/2010.

CLINICAL DATA: Abdominal pain with bloody diarrhea.

ABDOMEN - 2 VIEW

[w abdomen upright]
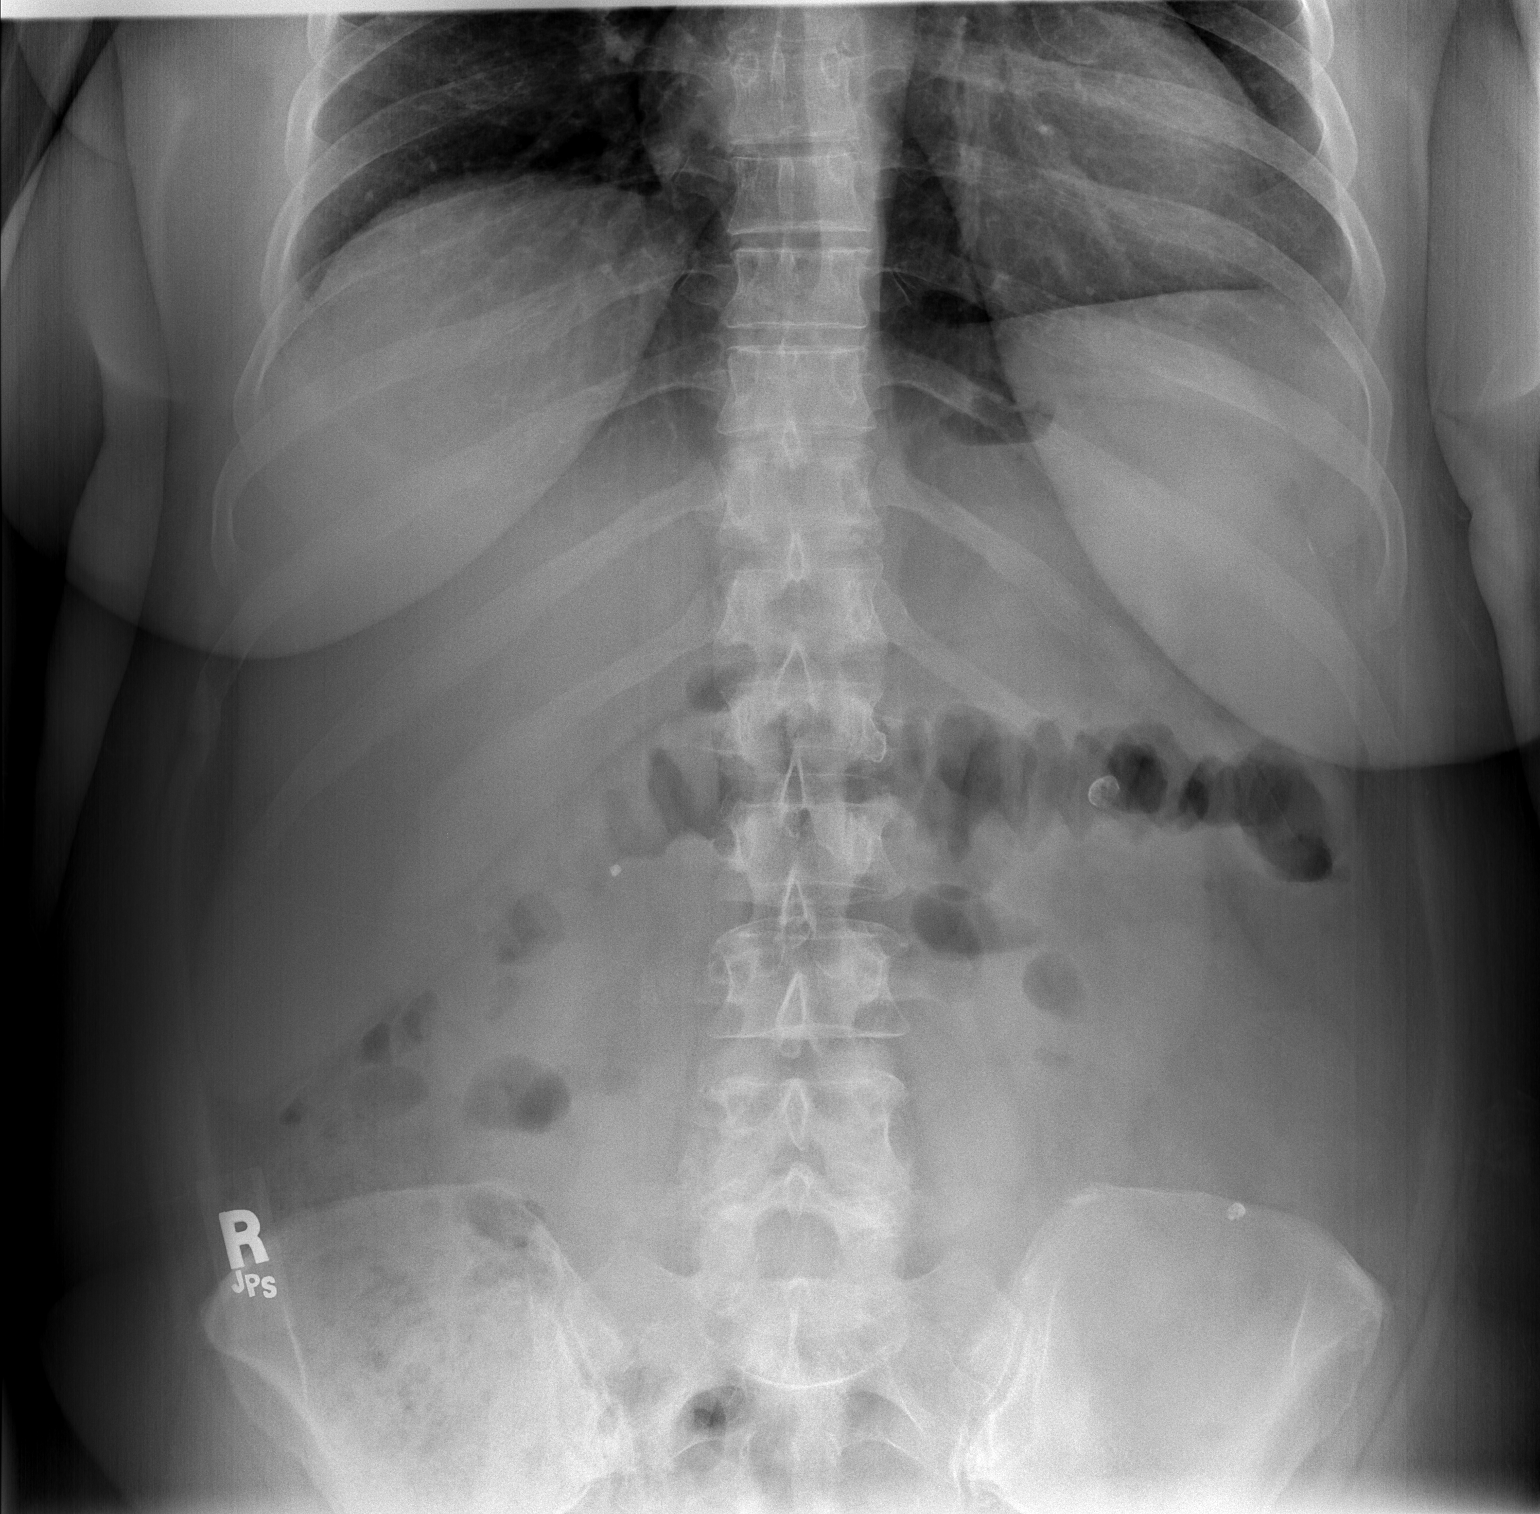

[t abdomen supine]
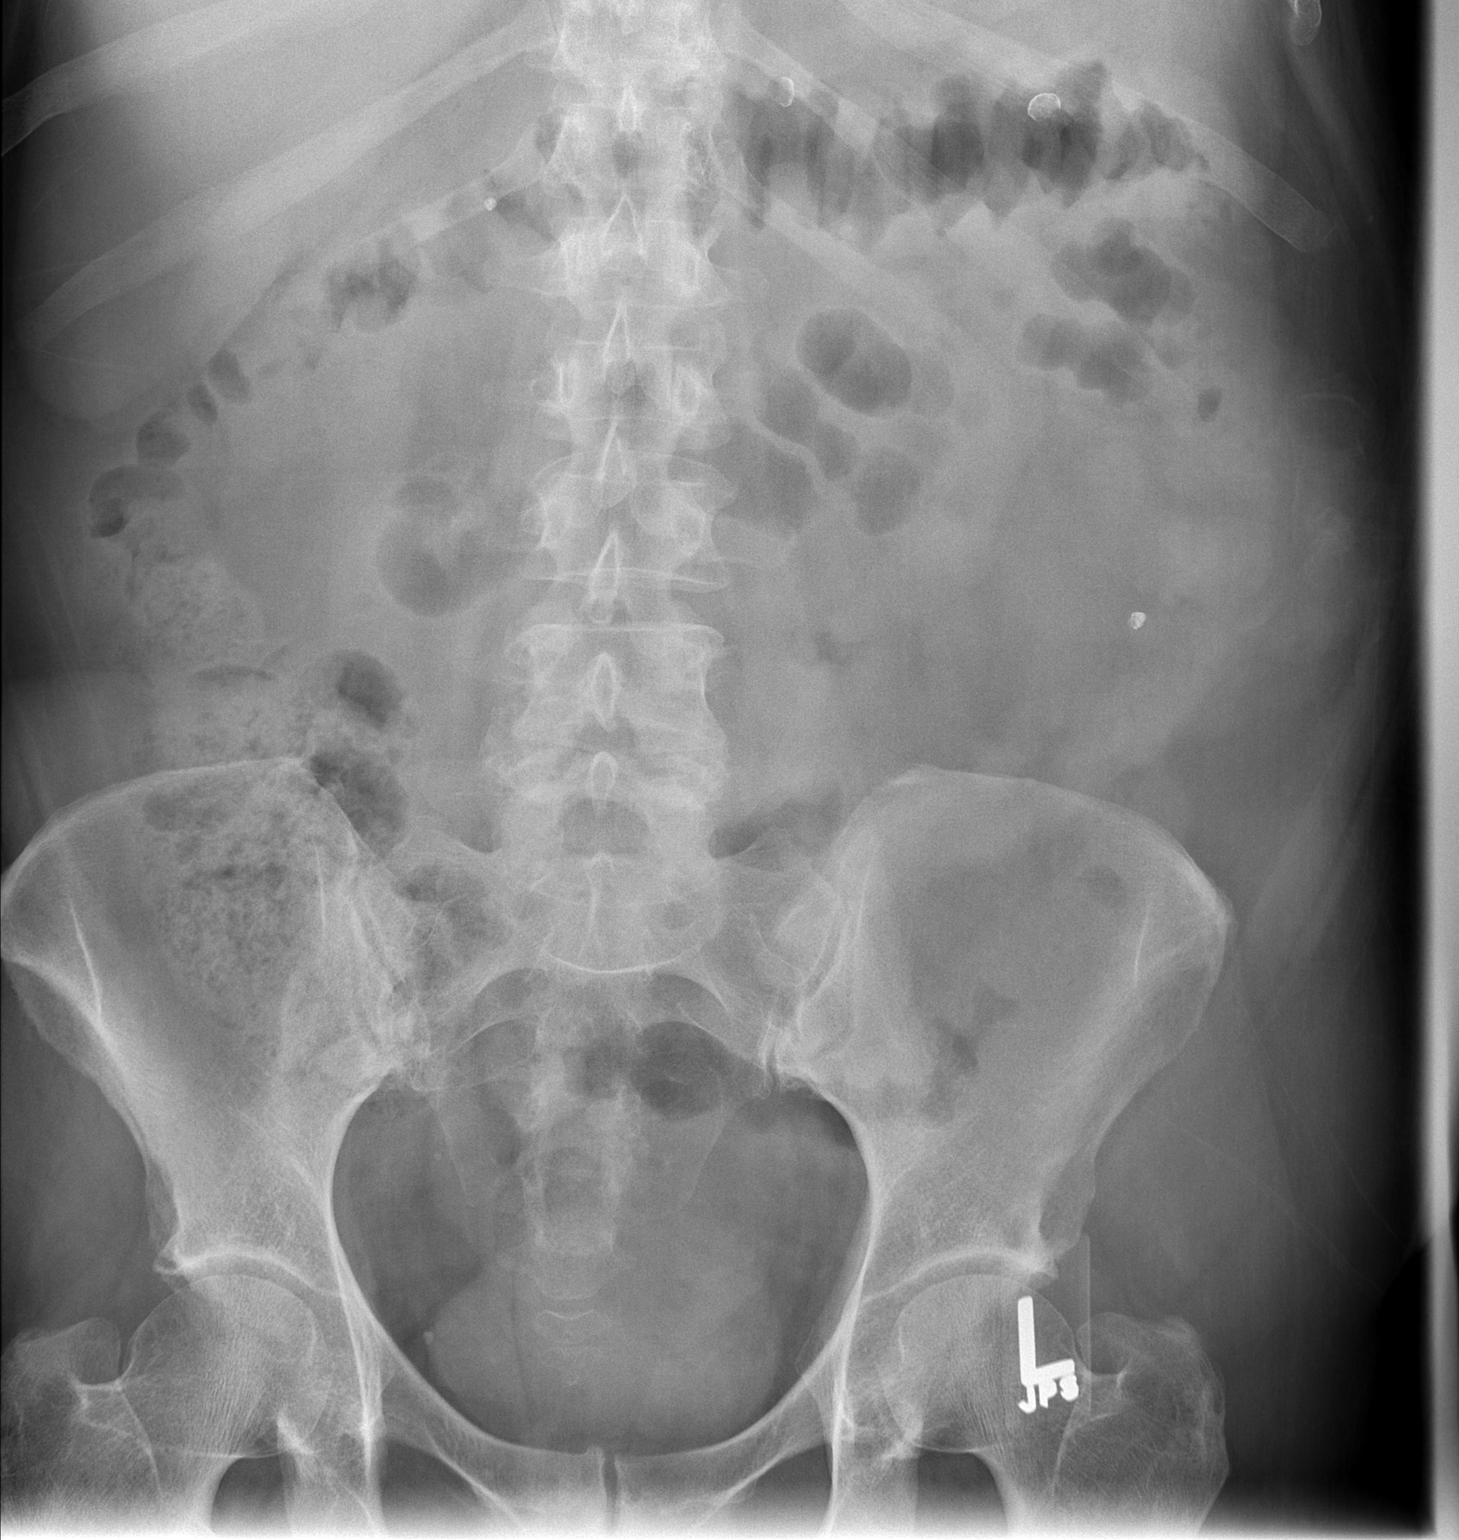

[2 of 2 positions shown; findings below may reference images not displayed]

FINDINGS: Question wall thickening involving the transverse colon.
Bowel gas pattern is otherwise unremarkable.  No unexpected
radiopaque calculi.
IMPRESSION: Question fold thickening involving the transverse colon.

## 2013-02-01 IMAGING — CR DG CHEST 2V
2 series · 2 of 2 positions shown · non-contrast
Comparison: None

CLINICAL DATA: Fever unknown origin.  Crohns disease.

CHEST - 2 VIEW

[w chest pa]
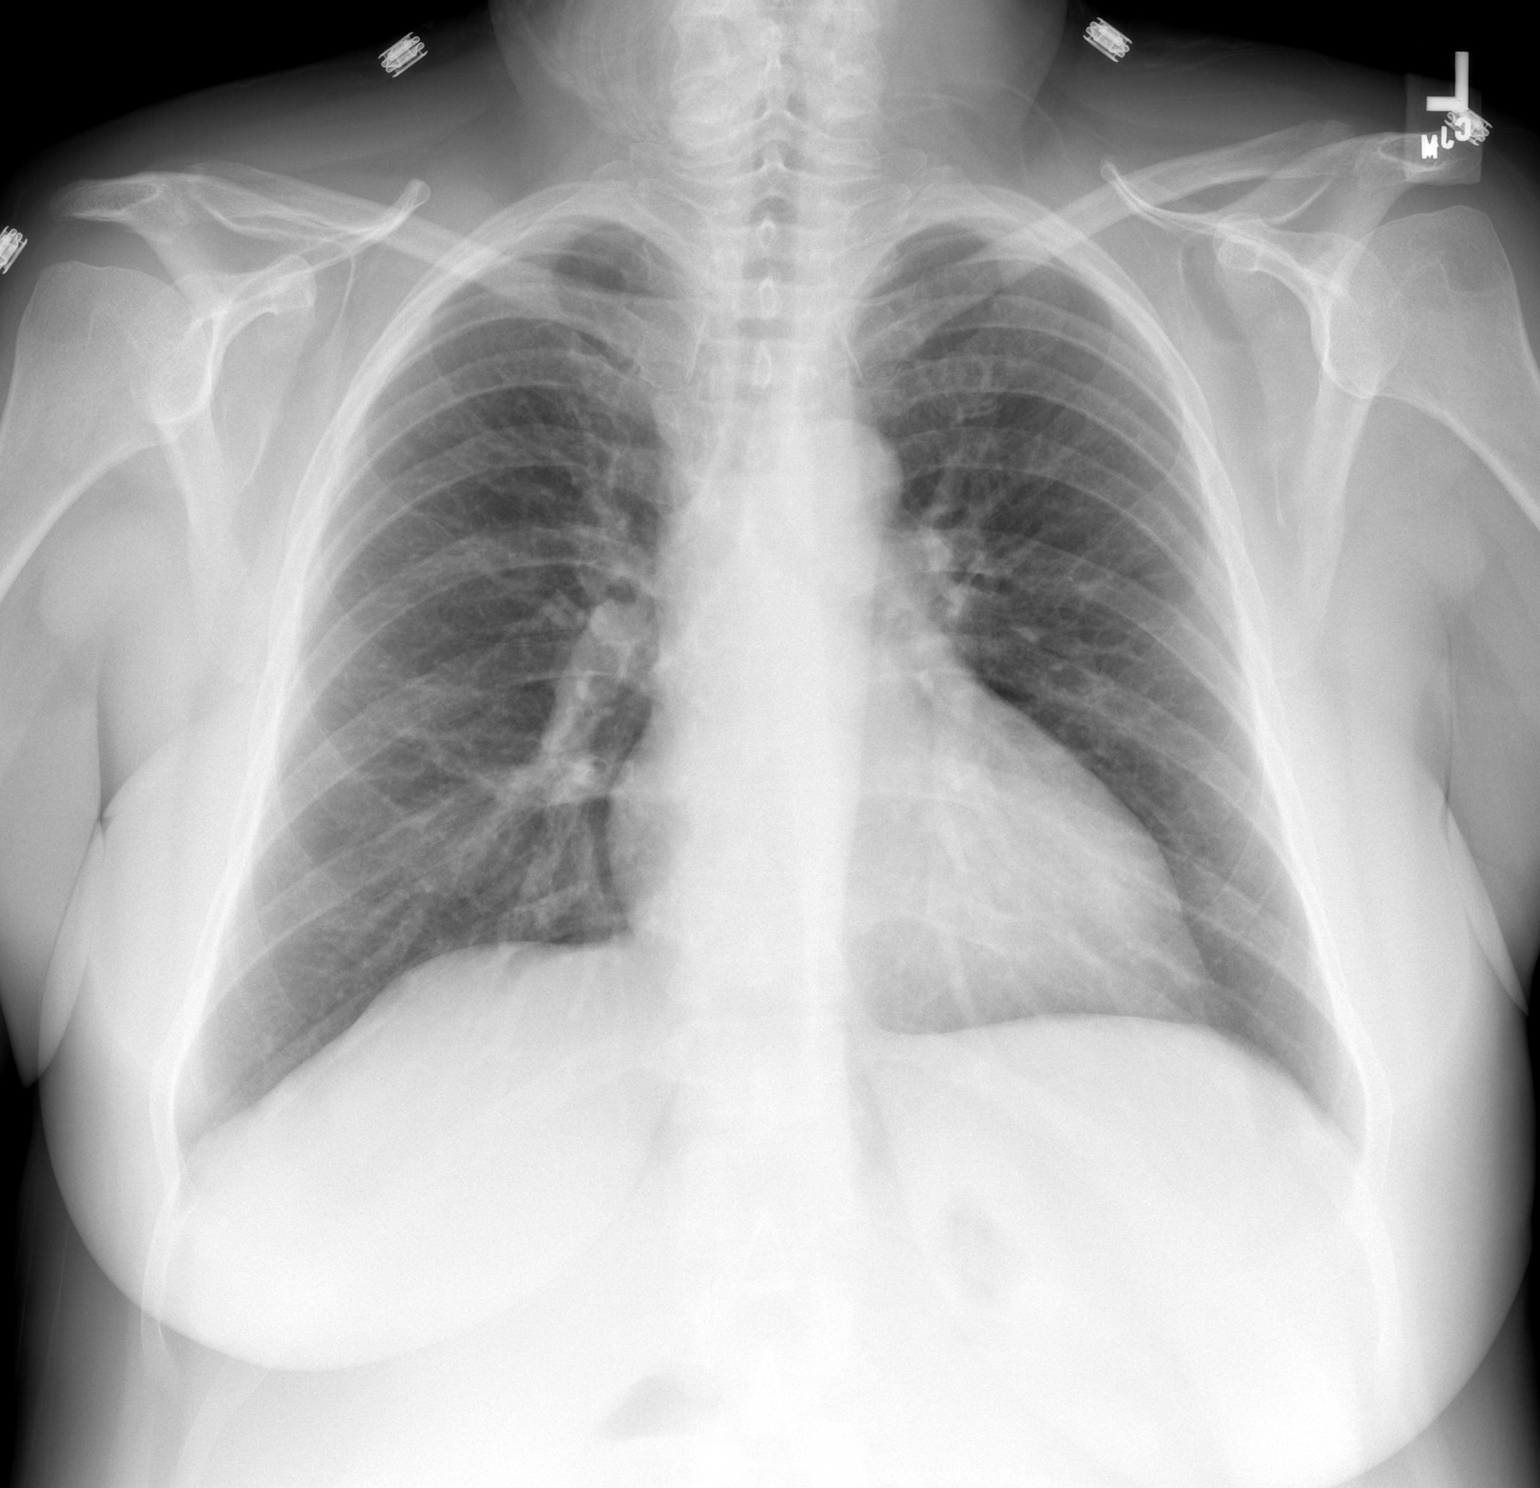

[w chest lat]
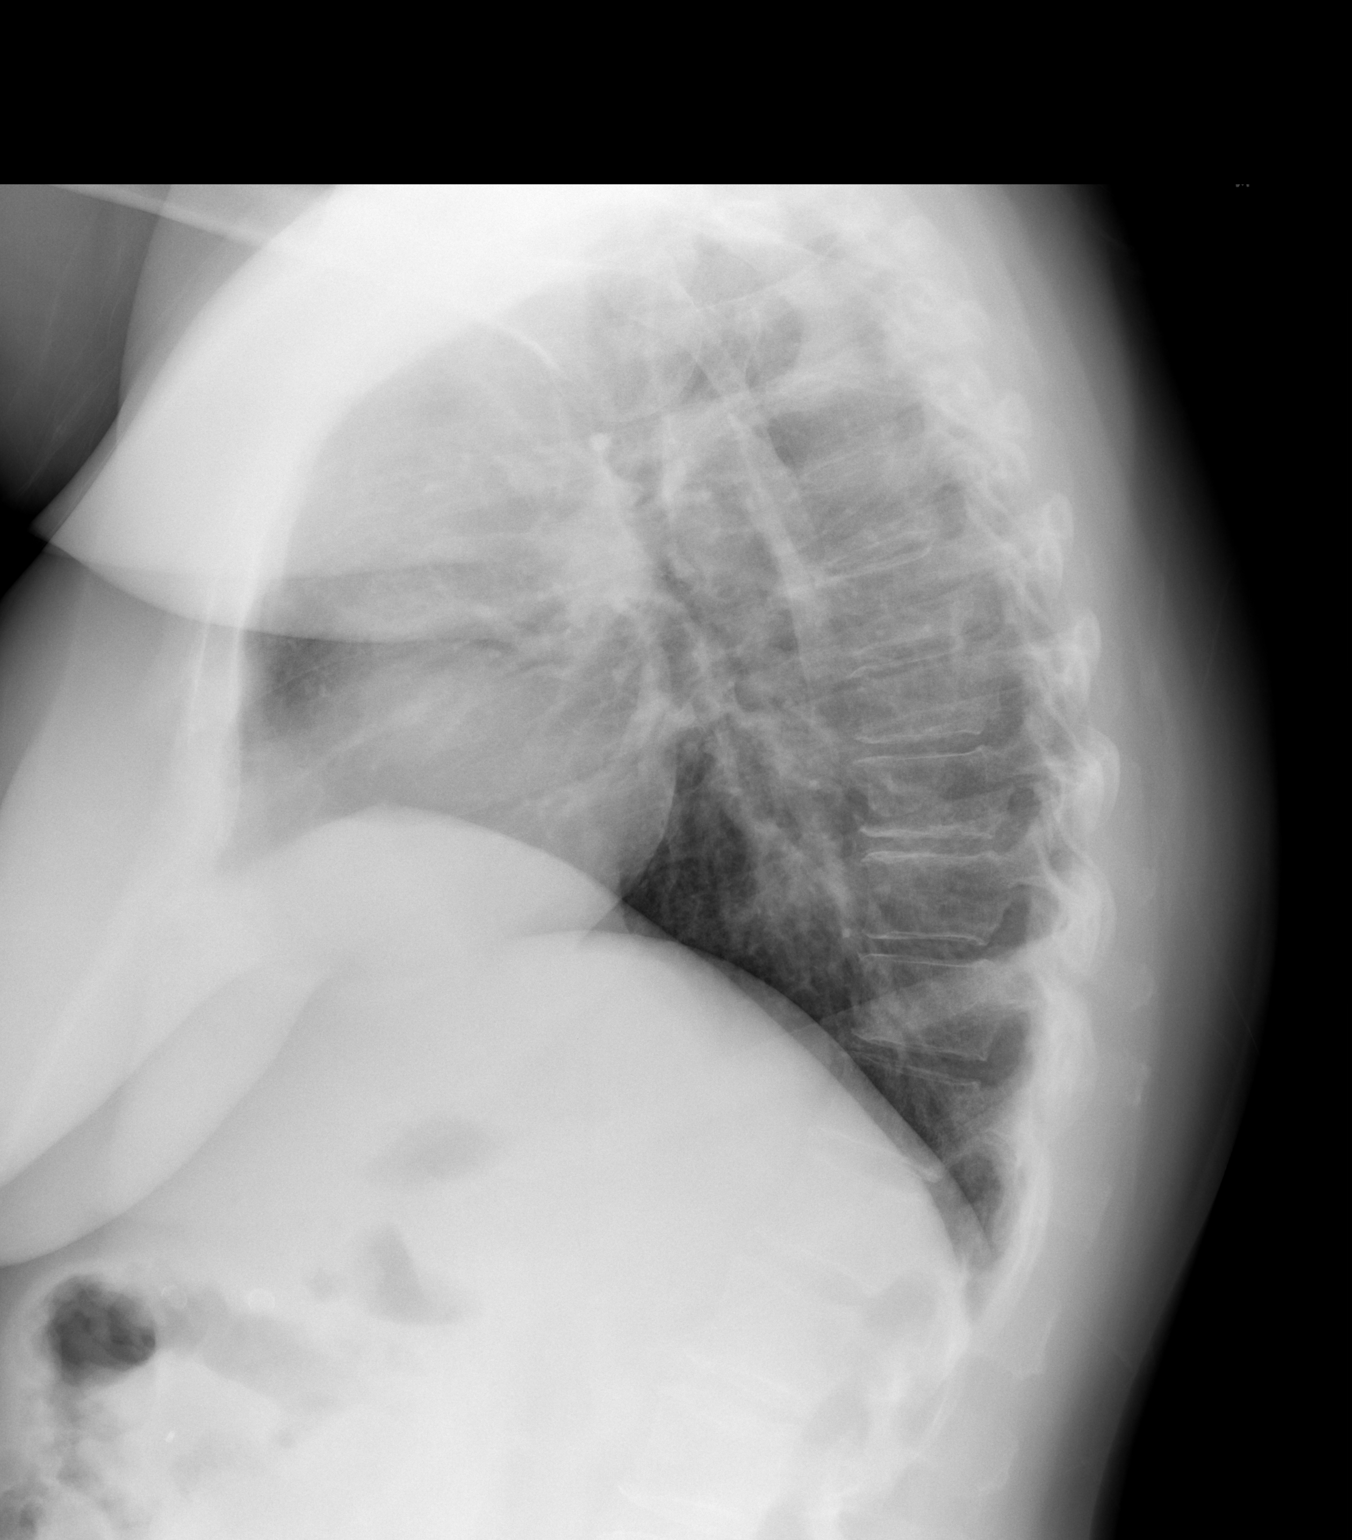

[2 of 2 positions shown; findings below may reference images not displayed]

FINDINGS: Heart size is upper normal.  Negative for heart failure.
Negative for pneumonia or effusion.  Lungs are clear.
IMPRESSION: No active cardiopulmonary disease.

## 2013-02-03 IMAGING — CR DG CHEST 1V PORT
1 series · 1 of 1 positions shown · non-contrast
Comparison: 01/27/2011.

CLINICAL DATA: 54-year-old female with edema.

PORTABLE CHEST - 1 VIEW

[AP]
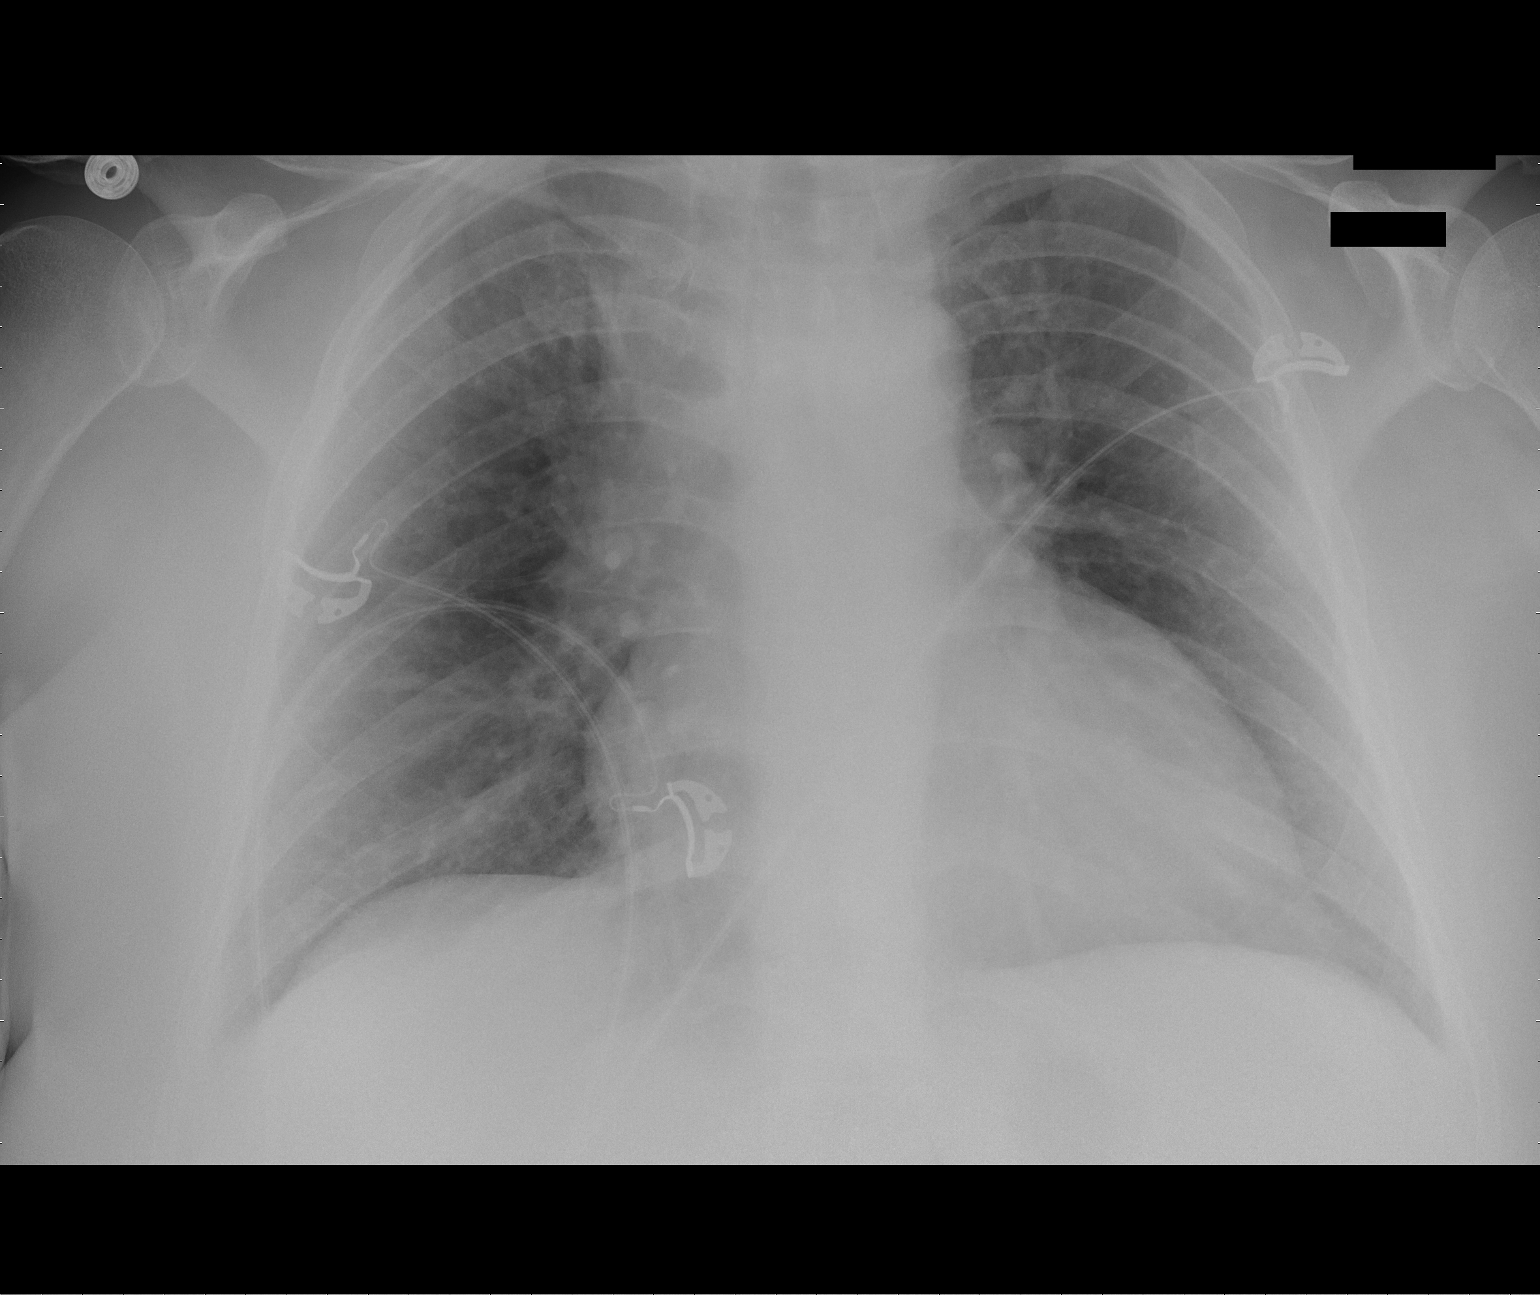

[1 of 1 positions shown; findings below may reference images not displayed]

FINDINGS: Upright portable AP view 4676 hours.  Stable lung
volumes.  Cardiac size and mediastinal contours are within normal
limits.  Visualized tracheal air column is within normal limits.
Allowing for portable technique, lung parenchyma is stable and
clear.
IMPRESSION: No acute cardiopulmonary abnormality.

## 2014-02-02 ENCOUNTER — Other Ambulatory Visit: Payer: Self-pay | Admitting: Dermatology

## 2015-04-11 ENCOUNTER — Encounter (HOSPITAL_BASED_OUTPATIENT_CLINIC_OR_DEPARTMENT_OTHER): Payer: Self-pay | Admitting: *Deleted

## 2015-04-11 NOTE — Progress Notes (Signed)
NPO AFTER MN. ARRIVE AT 0900.  NEEDS HG. 

## 2015-04-15 ENCOUNTER — Encounter (HOSPITAL_BASED_OUTPATIENT_CLINIC_OR_DEPARTMENT_OTHER)
Admission: RE | Disposition: A | Discharge status: Home / Self Care | Payer: Self-pay | Source: Ambulatory Visit | Attending: Orthopedic Surgery

## 2015-04-15 ENCOUNTER — Ambulatory Visit (HOSPITAL_BASED_OUTPATIENT_CLINIC_OR_DEPARTMENT_OTHER)
Admission: RE | Admit: 2015-04-15 | Discharge: 2015-04-15 | Disposition: A | Discharge status: Home / Self Care | Payer: 59 | Source: Ambulatory Visit | Attending: Orthopedic Surgery | Admitting: Orthopedic Surgery

## 2015-04-15 ENCOUNTER — Ambulatory Visit (HOSPITAL_BASED_OUTPATIENT_CLINIC_OR_DEPARTMENT_OTHER): Payer: 59 | Admitting: Anesthesiology

## 2015-04-15 ENCOUNTER — Encounter (HOSPITAL_BASED_OUTPATIENT_CLINIC_OR_DEPARTMENT_OTHER): Payer: Self-pay | Admitting: *Deleted

## 2015-04-15 DIAGNOSIS — W1842XA Slipping, tripping and stumbling without falling due to stepping into hole or opening, initial encounter: Secondary | ICD-10-CM | POA: Insufficient documentation

## 2015-04-15 DIAGNOSIS — S83232A Complex tear of medial meniscus, current injury, left knee, initial encounter: Secondary | ICD-10-CM | POA: Insufficient documentation

## 2015-04-15 DIAGNOSIS — Y92096 Garden or yard of other non-institutional residence as the place of occurrence of the external cause: Secondary | ICD-10-CM | POA: Diagnosis not present

## 2015-04-15 DIAGNOSIS — Z6835 Body mass index (BMI) 35.0-35.9, adult: Secondary | ICD-10-CM | POA: Diagnosis not present

## 2015-04-15 DIAGNOSIS — M25562 Pain in left knee: Secondary | ICD-10-CM | POA: Diagnosis present

## 2015-04-15 DIAGNOSIS — K5090 Crohn's disease, unspecified, without complications: Secondary | ICD-10-CM | POA: Diagnosis not present

## 2015-04-15 DIAGNOSIS — S83242A Other tear of medial meniscus, current injury, left knee, initial encounter: Secondary | ICD-10-CM

## 2015-04-15 DIAGNOSIS — Z933 Colostomy status: Secondary | ICD-10-CM | POA: Diagnosis not present

## 2015-04-15 DIAGNOSIS — M94262 Chondromalacia, left knee: Secondary | ICD-10-CM | POA: Diagnosis not present

## 2015-04-15 HISTORY — DX: Postmenopausal bleeding: N95.0

## 2015-04-15 HISTORY — DX: Colostomy status: Z93.3

## 2015-04-15 HISTORY — PX: KNEE ARTHROSCOPY: SHX127

## 2015-04-15 HISTORY — DX: Rash and other nonspecific skin eruption: R21

## 2015-04-15 LAB — POCT HEMOGLOBIN-HEMACUE: Hemoglobin: 12.7 g/dL (ref 12.0–15.0)

## 2015-04-15 SURGERY — ARTHROSCOPY, KNEE
Anesthesia: General | Site: Knee | Laterality: Left

## 2015-04-15 MED ORDER — MIDAZOLAM HCL 5 MG/5ML IJ SOLN
INTRAMUSCULAR | Status: DC | PRN
  Administered 2015-04-15: 2 mg via INTRAVENOUS

## 2015-04-15 MED ORDER — DEXAMETHASONE SODIUM PHOSPHATE 4 MG/ML IJ SOLN
INTRAMUSCULAR | Status: DC | PRN
  Administered 2015-04-15: 10 mg via INTRAVENOUS

## 2015-04-15 MED ORDER — LIDOCAINE-EPINEPHRINE (PF) 1 %-1:200000 IJ SOLN
INTRAMUSCULAR | Status: DC | PRN
  Administered 2015-04-15: 15 mL

## 2015-04-15 MED ORDER — LIDOCAINE HCL (CARDIAC) 20 MG/ML IV SOLN
INTRAVENOUS | Status: DC | PRN
  Administered 2015-04-15: 80 mg via INTRAVENOUS

## 2015-04-15 MED ORDER — DEXAMETHASONE SODIUM PHOSPHATE 10 MG/ML IJ SOLN
INTRAMUSCULAR | Status: AC
  Filled 2015-04-15: qty 1

## 2015-04-15 MED ORDER — ACETAMINOPHEN 325 MG PO TABS
325.0000 mg | ORAL_TABLET | ORAL | Status: DC | PRN
  Filled 2015-04-15: qty 2

## 2015-04-15 MED ORDER — FENTANYL CITRATE (PF) 100 MCG/2ML IJ SOLN
INTRAMUSCULAR | Status: DC | PRN
  Administered 2015-04-15: 50 ug via INTRAVENOUS
  Administered 2015-04-15: 25 ug via INTRAVENOUS
  Administered 2015-04-15: 50 ug via INTRAVENOUS
  Administered 2015-04-15: 25 ug via INTRAVENOUS
  Administered 2015-04-15: 50 ug via INTRAVENOUS

## 2015-04-15 MED ORDER — FENTANYL CITRATE (PF) 100 MCG/2ML IJ SOLN
25.0000 ug | INTRAMUSCULAR | Status: DC | PRN
  Filled 2015-04-15: qty 1

## 2015-04-15 MED ORDER — BUPIVACAINE HCL (PF) 0.25 % IJ SOLN
INTRAMUSCULAR | Status: AC
  Filled 2015-04-15: qty 30

## 2015-04-15 MED ORDER — CEFAZOLIN SODIUM-DEXTROSE 2-3 GM-% IV SOLR
INTRAVENOUS | Status: AC
  Filled 2015-04-15: qty 50

## 2015-04-15 MED ORDER — ONDANSETRON HCL 4 MG/2ML IJ SOLN
INTRAMUSCULAR | Status: AC
  Filled 2015-04-15: qty 2

## 2015-04-15 MED ORDER — PROPOFOL 10 MG/ML IV BOLUS
INTRAVENOUS | Status: AC
  Filled 2015-04-15: qty 20

## 2015-04-15 MED ORDER — ACETAMINOPHEN 160 MG/5ML PO SOLN
325.0000 mg | ORAL | Status: DC | PRN
  Filled 2015-04-15: qty 20.3

## 2015-04-15 MED ORDER — CHLORHEXIDINE GLUCONATE 4 % EX LIQD
60.0000 mL | Freq: Once | CUTANEOUS | Status: DC
  Filled 2015-04-15: qty 60

## 2015-04-15 MED ORDER — EPHEDRINE SULFATE 50 MG/ML IJ SOLN
INTRAMUSCULAR | Status: DC | PRN
  Administered 2015-04-15: 10 mg via INTRAVENOUS

## 2015-04-15 MED ORDER — BUPIVACAINE-EPINEPHRINE 0.25% -1:200000 IJ SOLN
INTRAMUSCULAR | Status: DC | PRN
  Administered 2015-04-15: 30 mL

## 2015-04-15 MED ORDER — HYDROCODONE-ACETAMINOPHEN 5-325 MG PO TABS: 1.0000 | ORAL_TABLET | Freq: Four times a day (QID) | ORAL | Status: DC | PRN

## 2015-04-15 MED ORDER — FENTANYL CITRATE (PF) 100 MCG/2ML IJ SOLN
INTRAMUSCULAR | Status: AC
  Filled 2015-04-15: qty 2

## 2015-04-15 MED ORDER — LIDOCAINE HCL (CARDIAC) 20 MG/ML IV SOLN
INTRAVENOUS | Status: AC
  Filled 2015-04-15: qty 5

## 2015-04-15 MED ORDER — SODIUM CHLORIDE 0.9 % IR SOLN
Status: DC | PRN
  Administered 2015-04-15: 3000 mL

## 2015-04-15 MED ORDER — PROPOFOL 10 MG/ML IV BOLUS
INTRAVENOUS | Status: DC | PRN
  Administered 2015-04-15: 200 mg via INTRAVENOUS

## 2015-04-15 MED ORDER — MIDAZOLAM HCL 2 MG/2ML IJ SOLN
INTRAMUSCULAR | Status: AC
  Filled 2015-04-15: qty 2

## 2015-04-15 MED ORDER — LIDOCAINE-EPINEPHRINE (PF) 1 %-1:200000 IJ SOLN
INTRAMUSCULAR | Status: AC
  Filled 2015-04-15: qty 30

## 2015-04-15 MED ORDER — CEFAZOLIN SODIUM-DEXTROSE 2-3 GM-% IV SOLR
2.0000 g | INTRAVENOUS | Status: AC
  Administered 2015-04-15: 2 g via INTRAVENOUS
  Filled 2015-04-15: qty 50

## 2015-04-15 MED ORDER — OXYCODONE HCL 5 MG/5ML PO SOLN
5.0000 mg | Freq: Once | ORAL | Status: DC | PRN
  Filled 2015-04-15: qty 5

## 2015-04-15 MED ORDER — LACTATED RINGERS IV SOLN
INTRAVENOUS | Status: DC
  Administered 2015-04-15: 10:00:00 via INTRAVENOUS
  Filled 2015-04-15: qty 1000

## 2015-04-15 MED ORDER — ASPIRIN EC 325 MG PO TBEC: 325.0000 mg | DELAYED_RELEASE_TABLET | Freq: Every day | ORAL | Status: DC

## 2015-04-15 MED ORDER — OXYCODONE HCL 5 MG PO TABS
5.0000 mg | ORAL_TABLET | Freq: Once | ORAL | Status: DC | PRN
  Filled 2015-04-15: qty 1

## 2015-04-15 MED ORDER — MELOXICAM 7.5 MG PO TABS: 15.0000 mg | ORAL_TABLET | Freq: Every day | ORAL | Status: DC

## 2015-04-15 SURGICAL SUPPLY — 34 items
BANDAGE ELASTIC 6 VELCRO ST LF (GAUZE/BANDAGES/DRESSINGS) ×2 IMPLANT
BLADE 4.2CUDA (BLADE) IMPLANT
BLADE CUDA SHAVER 3.5 (BLADE) ×2 IMPLANT
CANISTER SUCT LVC 12 LTR MEDI- (MISCELLANEOUS) ×2 IMPLANT
CLOTH BEACON ORANGE TIMEOUT ST (SAFETY) ×2 IMPLANT
DRAPE ARTHROSCOPY W/POUCH 114 (DRAPES) ×2 IMPLANT
DRSG EMULSION OIL 3X3 NADH (GAUZE/BANDAGES/DRESSINGS) ×2 IMPLANT
DRSG PAD ABDOMINAL 8X10 ST (GAUZE/BANDAGES/DRESSINGS) ×1 IMPLANT
DURAPREP 26ML APPLICATOR (WOUND CARE) ×2 IMPLANT
ELECT MENISCUS 165MM 90D (ELECTRODE) IMPLANT
ELECT REM PT RETURN 9FT ADLT (ELECTROSURGICAL)
ELECTRODE REM PT RTRN 9FT ADLT (ELECTROSURGICAL) IMPLANT
GLOVE BIO SURGEON STRL SZ7.5 (GLOVE) ×3 IMPLANT
GOWN STRL REUS W/ TWL LRG LVL3 (GOWN DISPOSABLE) ×1 IMPLANT
GOWN STRL REUS W/TWL LRG LVL3 (GOWN DISPOSABLE) ×2
IV NS IRRIG 3000ML ARTHROMATIC (IV SOLUTION) ×2 IMPLANT
KIT ROOM TURNOVER WOR (KITS) ×2 IMPLANT
KNEE WRAP E Z 3 GEL PACK (MISCELLANEOUS) ×2 IMPLANT
MANIFOLD NEPTUNE II (INSTRUMENTS) ×1 IMPLANT
PACK ARTHROSCOPY DSU (CUSTOM PROCEDURE TRAY) ×2 IMPLANT
PACK BASIN DAY SURGERY FS (CUSTOM PROCEDURE TRAY) ×2 IMPLANT
PADDING CAST ABS 4INX4YD NS (CAST SUPPLIES) ×1
PADDING CAST ABS COTTON 4X4 ST (CAST SUPPLIES) IMPLANT
PENCIL BUTTON HOLSTER BLD 10FT (ELECTRODE) IMPLANT
SET ARTHROSCOPY TUBING (MISCELLANEOUS) ×2
SET ARTHROSCOPY TUBING LN (MISCELLANEOUS) ×1 IMPLANT
SPONGE GAUZE 4X4 12PLY (GAUZE/BANDAGES/DRESSINGS) ×2 IMPLANT
SPONGE GAUZE 4X4 12PLY STER LF (GAUZE/BANDAGES/DRESSINGS) ×1 IMPLANT
SUT ETHILON 4 0 PS 2 18 (SUTURE) ×2 IMPLANT
SYR 30ML LL (SYRINGE) ×2 IMPLANT
SYR CONTROL 10ML LL (SYRINGE) ×2 IMPLANT
TOWEL OR 17X24 6PK STRL BLUE (TOWEL DISPOSABLE) ×2 IMPLANT
TUBE CONNECTING 12X1/4 (SUCTIONS) ×2 IMPLANT
WATER STERILE IRR 500ML POUR (IV SOLUTION) ×2 IMPLANT

## 2015-04-15 NOTE — H&P (Signed)
  CC- Karen [name] is a 59 y.o. female who presents with left knee pain.  HPI- . Knee Pain: Patient presents with a knee injury involving the  left knee. Onset of the symptoms was several months ago. Inciting event: twisting injury while walking through yard and stepped in a hole. Current symptoms include giving out, pain located medial joint line and swelling. Pain is aggravated by lateral movements, pivoting and walking.  Patient has had no prior knee problems. Evaluation to date: plain films: No significant joint space narrowing, minimal osteophytic changes and MRI: abnormal with findings of medial meniscal tear as well as medial based chondromalacia. Treatment to date: avoidance of offending activity, prescription NSAIDS which are not very effective and rest.  Past Medical History  Diagnosis Date  . Crohn's disease ([place])     dx 2011  s/p  total colectomy 2013  . Erythematous rash     crohn's rash permanant  on bilateral legs  . PMB (postmenopausal bleeding)     per pt has period every year in december-- work up done (negative)  . Colostomy in place [facility])     Past Surgical History  Procedure Laterality Date  . Total colectomy w/ colostomy  Jan 2013  . Hysteroscopy w/d&c  2009  . Tonsillectomy  age 64    Prior to Admission medications   Medication Sig Start Date End Date Taking? Authorizing Provider  traMADol (ULTRAM) 50 MG tablet Take 50 mg by mouth every 12 (twelve) hours as needed.   Yes Historical Provider, MD    antalgic gait, soft tissue tenderness over medial joint line, effusion, collateral ligaments intact, normal ipsilateral hip exam, normal contralateral knee exam  Physical Examination: General appearance - alert, well appearing, and in no distress Mental status - alert, oriented to person, place, and time Chest - no tachypnea, retractions or cyanosis Heart - normal rate and regular rhythm Abdomen - soft, nontender, nondistended, no masses or  organomegaly Musculoskeletal - As noted above Extremities - peripheral pulses normal, no pedal edema, no clubbing or cyanosis Skin - normal coloration and turgor, no rashes, no suspicious skin lesions noted   Asessment/Plan--- Left knee medial meniscal tear associated with mild to moderate degenerative joint changes   Plan left knee arthroscopy with meniscal debridement and chondroplasty.  Procedure risks and potential comps discussed with patient who elects to proceed. Goals are decreased pain and increased function with a high likelihood of achieving both

## 2015-04-15 NOTE — Discharge Instructions (Signed)
Keep wounds dry until sutures out May shower just cover with bandaids  WBAT  Crutches as needed for first few days Begin moving knee as knee symptoms improve     Post Anesthesia Home Care Instructions  Activity: Get plenty of rest for the remainder of the day. A responsible adult should stay with you for 24 hours following the procedure.  For the next 24 hours, DO NOT: -Drive a car -Paediatric nurse -Drink alcoholic beverages -Take any medication unless instructed by your physician -Make any legal decisions or sign important papers.  Meals: Start with liquid foods such as gelatin or soup. Progress to regular foods as tolerated. Avoid greasy, spicy, heavy foods. If nausea and/or vomiting occur, drink only clear liquids until the nausea and/or vomiting subsides. Call your physician if vomiting continues.  Special Instructions/Symptoms: Your throat may feel dry or sore from the anesthesia or the breathing tube placed in your throat during surgery. If this causes discomfort, gargle with warm salt water. The discomfort should disappear within 24 hours.  If you had a scopolamine patch placed behind your ear for the management of post- operative nausea and/or vomiting:  1. The medication in the patch is effective for 72 hours, after which it should be removed.  Wrap patch in a tissue and discard in the trash. Wash hands thoroughly with soap and water. 2. You may remove the patch earlier than 72 hours if you experience unpleasant side effects which may include dry mouth, dizziness or visual disturbances. 3. Avoid touching the patch. Wash your hands with soap and water after contact with the patch.

## 2015-04-15 NOTE — Brief Op Note (Signed)
04/15/2015  7:33 AM  PATIENT:  Karen [name]  59 y.o. female  PRE-OPERATIVE DIAGNOSIS:  LEFT KNEE MEDIAL MENISCUS TEAR, CHONDROMALACIA medially  POST-OPERATIVE DIAGNOSIS:  1. Left knee medial meniscal tear, 2.  Grade II-IV chondromalacia medially, 3. 2 larger >1cm loose bodies  PROCEDURE:  Procedure(s): LEFT  Knee ARTHROSCOPY 1. Medial partial meniscectomy 2. Medial chondroplasty, chondral debridement with abrasion chondroplasty 3. Removal of loose bodies  SURGEON:  Surgeon(s) and Role:    * Paralee Cancel, MD - Primary  PHYSICIAN ASSISTANT: None   ANESTHESIA:   general  EBL:   min  BLOOD ADMINISTERED:none  DRAINS: none   LOCAL MEDICATIONS USED:  MARCAINE     SPECIMEN:  No Specimen  DISPOSITION OF SPECIMEN:  N/A  COUNTS:  YES  TOURNIQUET:  * No tourniquets in log *  DICTATION: .Other Dictation: Dictation Number [id number]  PLAN OF CARE: Discharge to home after PACU  PATIENT DISPOSITION:  PACU - hemodynamically stable.   Delay start of Pharmacological VTE agent (>24hrs) due to surgical blood loss or risk of bleeding: no

## 2015-04-15 NOTE — Transfer of Care (Signed)
Immediate Anesthesia Transfer of Care Note  Patient: Karen [name]  Procedure(s) Performed: Procedure(s) (LRB): LEFT ARTHROSCOPY KNEE WITH DEBRIDEMENT (Left)  Patient Location: PACU  Anesthesia Type: General  Level of Consciousness: awake, oriented, sedated and patient cooperative  Airway & Oxygen Therapy: Patient Spontanous Breathing and Patient connected to face mask oxygen  Post-op Assessment: Report given to PACU RN and Post -op Vital signs reviewed and stable  Post vital signs: Reviewed and stable  Complications: No apparent anesthesia complications

## 2015-04-15 NOTE — Anesthesia Procedure Notes (Signed)
Procedure Name: LMA Insertion Date/Time: 04/15/2015 11:25 AM Performed by: Denna Haggard D Pre-anesthesia Checklist: Patient identified, Emergency Drugs available, Suction available and Patient being monitored Patient Re-evaluated:Patient Re-evaluated prior to inductionOxygen Delivery Method: Circle System Utilized Preoxygenation: Pre-oxygenation with 100% oxygen Intubation Type: IV induction Ventilation: Mask ventilation without difficulty LMA: LMA inserted LMA Size: 4.0 Number of attempts: 1 Airway Equipment and Method: Bite block Placement Confirmation: positive ETCO2 Tube secured with: Tape Dental Injury: Teeth and Oropharynx as per pre-operative assessment

## 2015-04-15 NOTE — Anesthesia Preprocedure Evaluation (Signed)
Anesthesia Evaluation  Patient identified by MRN, date of birth, ID band Patient awake    Reviewed: Allergy & Precautions, NPO status , Patient's Chart, lab work & pertinent test results  History of Anesthesia Complications Negative for: history of anesthetic complications  Airway Mallampati: II  TM Distance: >3 FB Neck ROM: Full    Dental  (+) Teeth Intact   Pulmonary neg pulmonary ROS,    breath sounds clear to auscultation       Cardiovascular negative cardio ROS   Rhythm:Regular     Neuro/Psych negative neurological ROS  negative psych ROS   GI/Hepatic Neg liver ROS, Crohn's    Endo/Other  Morbid obesity  Renal/GU negative Renal ROS     Musculoskeletal   Abdominal   Peds  Hematology negative hematology ROS (+)   Anesthesia Other Findings   Reproductive/Obstetrics                             Anesthesia Physical Anesthesia Plan  ASA: II  Anesthesia Plan: General   Post-op Pain Management:    Induction: Intravenous  Airway Management Planned: LMA  Additional Equipment: None  Intra-op Plan:   Post-operative Plan: Extubation in OR  Informed Consent: I have reviewed the patients History and Physical, chart, labs and discussed the procedure including the risks, benefits and alternatives for the proposed anesthesia with the patient or authorized representative who has indicated his/her understanding and acceptance.   Dental advisory given  Plan Discussed with: CRNA and Surgeon  Anesthesia Plan Comments:         Anesthesia Quick Evaluation

## 2015-04-16 ENCOUNTER — Encounter (HOSPITAL_BASED_OUTPATIENT_CLINIC_OR_DEPARTMENT_OTHER): Payer: Self-pay | Admitting: Orthopedic Surgery

## 2015-04-16 NOTE — Anesthesia Postprocedure Evaluation (Signed)
Anesthesia Post Note  Patient: ARYKAH DEROCHER  Procedure(s) Performed: Procedure(s) (LRB): LEFT ARTHROSCOPY KNEE WITH DEBRIDEMENT (Left)  Patient location during evaluation: PACU Anesthesia Type: General Level of consciousness: awake and alert Pain management: pain level controlled Vital Signs Assessment: post-procedure vital signs reviewed and stable Respiratory status: spontaneous breathing Cardiovascular status: stable Postop Assessment: no signs of nausea or vomiting Anesthetic complications: no    Last Vitals:  Filed Vitals:   04/15/15 1245 04/15/15 1315  BP: 133/73 127/70  Pulse: 76 68  Temp:  36.6 C  Resp: 19 16    Last Pain:  Filed Vitals:   04/15/15 1323  PainSc: 4                  Kayra Crowell

## 2015-04-17 NOTE — Op Note (Signed)
[name], [name]               ACCOUNT NO.:  [id number]  MEDICAL RECORD NO.:  [id number]  LOCATION:                                 FACILITY:  PHYSICIAN:  Pietro Cassis. Alvan Dame, M.D.       DATE OF BIRTH:  DATE OF PROCEDURE:  04/15/2015 DATE OF DISCHARGE:                              OPERATIVE REPORT   PREOPERATIVE DIAGNOSIS:  Left knee medial meniscal tear.  POSTOPERATIVE DIAGNOSES/FINDINGS: 1. Complex tearing to the posterior horn of the medial meniscus. 2. Grade 3-4 defect in the medial femoral condyle with cartilage loss     and noted large loose bodies in the knee.  PROCEDURES: 1. Left knee diagnostic and operative arthroscopy. 2. Left knee partial medial meniscectomy. 3. Medial based chondroplasty with abrasion chondroplasty down to     punctate bleeding bone on some areas of this central defect of the     distal aspect of medial femoral condyle.  Associated with this, I     did perform the synovectomy. 4. Removal of two large loose bodies using the pituitary rongeur.  SURGEON:  Pietro Cassis. Alvan Dame, M.D.  ASSISTANT:  Surgical team.  ANESTHESIA:  General plus local blocks.  SPECIMENS:  None.  COMPLICATIONS:  Tourniquet not utilized.  DRAINS:  None.  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  She has had persistent recurring, aggravating symptoms over the medial aspect of the joint. 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 She at this point wished to proceed with surgical intervention based on the persistence of her symptoms and inability to tolerate the current condition of the knee with regard to life activities as well as with work.  Risks of potential progressive arthritis, risks of recurrent meniscal pathology, progressive  degenerative changes were all discussed and reviewed in the setting of her current condition in addition to standard risk of infection, DVT, consent was obtained for benefit of pain relief.  PROCEDURE IN DETAIL:  The patient was brought to the operative theater. Once adequate anesthesia, preoperative antibiotics, Ancef administered, the patient was positioned supine with the left leg in a leg holder. Left lower extremity was then prepped and draped in sterile fashion. Time-out was performed identifying the patient, planned procedure and extremity.  The standard inferior-medial, superior-medial, inferior-lateral portals were utilized.  Diagnostic evaluation of the knee revealed the above findings.  The extent of the degenerative chondral area on the central aspect of the distal femoral condyle was not totally expected.  I used a combination of the straight-biting basket to debride the meniscus back to a stable level, then inserted the 3.5 Cuda shaver to remove the meniscal debris and blended and contoured remaining meniscus back to a stable level.  Once I was satisfied with overall stability of the remaining medial meniscus, I addressed the chondral defect.  At this point, we used the 3.5 Cuda shaver to perform chondroplasty to the unstable flaps around the border of this area  and a mallet to an area of almost probably 1.5 cm to 2 in diameter.  The 3.5 Cuda shaver was also used to debride some areas of chondral that had some exposed bone on this area.  She had no associated tibial lesion.  The abrasion chondroplasty was carried down to punctate bleeding bone.  Once I finished this area, I then evaluated laterally, found an intact ACL, hypertrophic synovium, but also an intact lateral compartment with minimal degenerative changes.  Anteriorly, there were minimal degenerative changes on the patellofemoral compartment.  Throughout this time, I did identify two large chondral loose  bodies. One of them had little bit more of a calcification appearance to it, these were removed using the pituitary rongeur.  The one that had started to calcify was removed in pieces.  Following the above-stated procedure as well as removal of these loose bodies, I reexamined the knee several times to make certain that there was no other unstable cartilage fragments or do any loose debris.  Once I was satisfied with this, the instrumentation was removed.  Portal sites were reapproximated with 4-0 nylon.  The knee was injected at the end of the case with 0.25% Marcaine with epinephrine.  She was then brought to the recovery room with a sterile bulky Jones dressing in place, tolerating the procedure well.  I will see her back in the office in 2 weeks, findings were reviewed with her daughter.     Pietro Cassis Alvan Dame, M.D.     MDO/MEDQ  D:  04/16/2015  T:  04/17/2015  Job:  [id number]

## 2015-06-25 ENCOUNTER — Encounter: Payer: 59 | Admitting: Nurse Practitioner

## 2015-07-02 DIAGNOSIS — Z932 Ileostomy status: Secondary | ICD-10-CM | POA: Diagnosis not present

## 2015-07-02 DIAGNOSIS — C787 Secondary malignant neoplasm of liver and intrahepatic bile duct: Secondary | ICD-10-CM | POA: Diagnosis not present

## 2015-07-15 ENCOUNTER — Encounter: Payer: 59 | Admitting: Nurse Practitioner

## 2015-08-01 DIAGNOSIS — C787 Secondary malignant neoplasm of liver and intrahepatic bile duct: Secondary | ICD-10-CM | POA: Diagnosis not present

## 2015-08-01 DIAGNOSIS — Z932 Ileostomy status: Secondary | ICD-10-CM | POA: Diagnosis not present

## 2015-10-11 DIAGNOSIS — C787 Secondary malignant neoplasm of liver and intrahepatic bile duct: Secondary | ICD-10-CM | POA: Diagnosis not present

## 2015-10-11 DIAGNOSIS — Z932 Ileostomy status: Secondary | ICD-10-CM | POA: Diagnosis not present

## 2015-11-18 ENCOUNTER — Encounter: Payer: 59 | Admitting: Nurse Practitioner

## 2016-01-08 DIAGNOSIS — C787 Secondary malignant neoplasm of liver and intrahepatic bile duct: Secondary | ICD-10-CM | POA: Diagnosis not present

## 2016-01-08 DIAGNOSIS — Z932 Ileostomy status: Secondary | ICD-10-CM | POA: Diagnosis not present

## 2016-02-07 DIAGNOSIS — C787 Secondary malignant neoplasm of liver and intrahepatic bile duct: Secondary | ICD-10-CM | POA: Diagnosis not present

## 2016-02-07 DIAGNOSIS — Z932 Ileostomy status: Secondary | ICD-10-CM | POA: Diagnosis not present

## 2016-03-06 DIAGNOSIS — Z932 Ileostomy status: Secondary | ICD-10-CM | POA: Diagnosis not present

## 2016-03-06 DIAGNOSIS — C787 Secondary malignant neoplasm of liver and intrahepatic bile duct: Secondary | ICD-10-CM | POA: Diagnosis not present

## 2016-04-03 DIAGNOSIS — Z932 Ileostomy status: Secondary | ICD-10-CM | POA: Diagnosis not present

## 2016-04-03 DIAGNOSIS — C787 Secondary malignant neoplasm of liver and intrahepatic bile duct: Secondary | ICD-10-CM | POA: Diagnosis not present

## 2016-07-27 DIAGNOSIS — L531 Erythema annulare centrifugum: Secondary | ICD-10-CM | POA: Diagnosis not present

## 2016-07-27 DIAGNOSIS — K5090 Crohn's disease, unspecified, without complications: Secondary | ICD-10-CM | POA: Diagnosis not present

## 2016-10-15 DIAGNOSIS — Z5321 Procedure and treatment not carried out due to patient leaving prior to being seen by health care provider: Secondary | ICD-10-CM | POA: Diagnosis not present

## 2016-10-15 DIAGNOSIS — K5900 Constipation, unspecified: Secondary | ICD-10-CM | POA: Diagnosis not present

## 2016-10-16 ENCOUNTER — Emergency Department (HOSPITAL_COMMUNITY)
Admission: EM | Admit: 2016-10-16 | Discharge: 2016-10-16 | Disposition: A | Discharge status: Home / Self Care | Payer: 59 | Attending: Emergency Medicine | Admitting: Emergency Medicine

## 2016-10-16 ENCOUNTER — Encounter (HOSPITAL_COMMUNITY): Payer: Self-pay | Admitting: Emergency Medicine

## 2016-10-16 DIAGNOSIS — Z5321 Procedure and treatment not carried out due to patient leaving prior to being seen by health care provider: Secondary | ICD-10-CM | POA: Diagnosis not present

## 2016-10-16 DIAGNOSIS — K5900 Constipation, unspecified: Secondary | ICD-10-CM | POA: Diagnosis not present

## 2016-10-16 LAB — CBC WITH DIFFERENTIAL/PLATELET
BASOS ABS: 0.1 10*3/uL (ref 0.0–0.1)
Basophils Relative: 0 %
EOS ABS: 0.1 10*3/uL (ref 0.0–0.7)
Eosinophils Relative: 1 %
HCT: 42.5 % (ref 36.0–46.0)
HEMOGLOBIN: 14.3 g/dL (ref 12.0–15.0)
Lymphocytes Relative: 10 %
Lymphs Abs: 1.2 10*3/uL (ref 0.7–4.0)
MCH: 28.9 pg (ref 26.0–34.0)
MCHC: 33.6 g/dL (ref 30.0–36.0)
MCV: 86 fL (ref 78.0–100.0)
Monocytes Absolute: 0.7 10*3/uL (ref 0.1–1.0)
Monocytes Relative: 6 %
NEUTROS PCT: 83 %
Neutro Abs: 9.5 10*3/uL — ABNORMAL HIGH (ref 1.7–7.7)
PLATELETS: 237 10*3/uL (ref 150–400)
RBC: 4.94 MIL/uL (ref 3.87–5.11)
RDW: 13.2 % (ref 11.5–15.5)
WBC: 11.6 10*3/uL — AB (ref 4.0–10.5)

## 2016-10-16 LAB — COMPREHENSIVE METABOLIC PANEL
ALBUMIN: 4.6 g/dL (ref 3.5–5.0)
ALT: 21 U/L (ref 14–54)
AST: 22 U/L (ref 15–41)
Alkaline Phosphatase: 101 U/L (ref 38–126)
Anion gap: 7 (ref 5–15)
BUN: 19 mg/dL (ref 6–20)
CHLORIDE: 105 mmol/L (ref 101–111)
CO2: 26 mmol/L (ref 22–32)
Calcium: 9.6 mg/dL (ref 8.9–10.3)
Creatinine, Ser: 1.07 mg/dL — ABNORMAL HIGH (ref 0.44–1.00)
GFR calc Af Amer: >60 mL/min (ref >60)
GFR calc non Af Amer: 55 mL/min — ABNORMAL LOW (ref >60)
GLUCOSE: 117 mg/dL — AB (ref 65–99)
Potassium: 4.6 mmol/L (ref 3.5–5.1)
SODIUM: 138 mmol/L (ref 135–145)
Total Bilirubin: 0.8 mg/dL (ref 0.3–1.2)
Total Protein: 7.5 g/dL (ref 6.5–8.1)

## 2016-10-16 NOTE — ED Notes (Signed)
Pt states she started feeling better and decided she didn't need to be seen.

## 2016-10-16 NOTE — ED Triage Notes (Addendum)
Patient complaining of having abdominal pain from eating broccoli, carrots, and granola. Patient has an ostomy and has had no output today. Patient has been nauseated and vomiting. Patient is also having trouble passing urine.

## 2016-10-16 NOTE — ED Notes (Signed)
Pt has a colostomy and she felt like she had a blockage but as she was walking back to the room, the air released and pt felt better and didn't want to be seen

## 2016-10-31 DIAGNOSIS — C787 Secondary malignant neoplasm of liver and intrahepatic bile duct: Secondary | ICD-10-CM | POA: Diagnosis not present

## 2016-10-31 DIAGNOSIS — Z932 Ileostomy status: Secondary | ICD-10-CM | POA: Diagnosis not present

## 2017-01-05 DIAGNOSIS — C787 Secondary malignant neoplasm of liver and intrahepatic bile duct: Secondary | ICD-10-CM | POA: Diagnosis not present

## 2017-01-05 DIAGNOSIS — Z932 Ileostomy status: Secondary | ICD-10-CM | POA: Diagnosis not present

## 2017-03-01 DIAGNOSIS — Z932 Ileostomy status: Secondary | ICD-10-CM | POA: Diagnosis not present

## 2017-03-01 DIAGNOSIS — C787 Secondary malignant neoplasm of liver and intrahepatic bile duct: Secondary | ICD-10-CM | POA: Diagnosis not present

## 2017-03-31 DIAGNOSIS — Z932 Ileostomy status: Secondary | ICD-10-CM | POA: Diagnosis not present

## 2017-03-31 DIAGNOSIS — C787 Secondary malignant neoplasm of liver and intrahepatic bile duct: Secondary | ICD-10-CM | POA: Diagnosis not present

## 2017-10-11 DIAGNOSIS — Z932 Ileostomy status: Secondary | ICD-10-CM | POA: Diagnosis not present

## 2017-10-11 DIAGNOSIS — C787 Secondary malignant neoplasm of liver and intrahepatic bile duct: Secondary | ICD-10-CM | POA: Diagnosis not present

## 2017-11-06 DIAGNOSIS — C787 Secondary malignant neoplasm of liver and intrahepatic bile duct: Secondary | ICD-10-CM | POA: Diagnosis not present

## 2017-11-06 DIAGNOSIS — Z932 Ileostomy status: Secondary | ICD-10-CM | POA: Diagnosis not present

## 2018-03-12 DIAGNOSIS — C787 Secondary malignant neoplasm of liver and intrahepatic bile duct: Secondary | ICD-10-CM | POA: Diagnosis not present

## 2018-03-12 DIAGNOSIS — Z932 Ileostomy status: Secondary | ICD-10-CM | POA: Diagnosis not present

## 2018-04-11 DIAGNOSIS — Z932 Ileostomy status: Secondary | ICD-10-CM | POA: Diagnosis not present

## 2018-04-11 DIAGNOSIS — C787 Secondary malignant neoplasm of liver and intrahepatic bile duct: Secondary | ICD-10-CM | POA: Diagnosis not present

## 2018-05-12 DIAGNOSIS — Z932 Ileostomy status: Secondary | ICD-10-CM | POA: Diagnosis not present

## 2018-05-12 DIAGNOSIS — C787 Secondary malignant neoplasm of liver and intrahepatic bile duct: Secondary | ICD-10-CM | POA: Diagnosis not present

## 2019-02-09 DIAGNOSIS — L570 Actinic keratosis: Secondary | ICD-10-CM | POA: Diagnosis not present

## 2019-02-09 DIAGNOSIS — D485 Neoplasm of uncertain behavior of skin: Secondary | ICD-10-CM | POA: Diagnosis not present

## 2019-02-09 DIAGNOSIS — L817 Pigmented purpuric dermatosis: Secondary | ICD-10-CM | POA: Diagnosis not present

## 2019-02-09 DIAGNOSIS — C44712 Basal cell carcinoma of skin of right lower limb, including hip: Secondary | ICD-10-CM | POA: Diagnosis not present

## 2019-02-09 DIAGNOSIS — C44212 Basal cell carcinoma of skin of right ear and external auricular canal: Secondary | ICD-10-CM | POA: Diagnosis not present

## 2019-02-09 DIAGNOSIS — C44519 Basal cell carcinoma of skin of other part of trunk: Secondary | ICD-10-CM | POA: Diagnosis not present

## 2019-02-28 DIAGNOSIS — Z85828 Personal history of other malignant neoplasm of skin: Secondary | ICD-10-CM | POA: Diagnosis not present

## 2019-02-28 DIAGNOSIS — C44212 Basal cell carcinoma of skin of right ear and external auricular canal: Secondary | ICD-10-CM | POA: Diagnosis not present

## 2019-03-07 DIAGNOSIS — Z85828 Personal history of other malignant neoplasm of skin: Secondary | ICD-10-CM | POA: Diagnosis not present

## 2019-03-07 DIAGNOSIS — C44619 Basal cell carcinoma of skin of left upper limb, including shoulder: Secondary | ICD-10-CM | POA: Diagnosis not present

## 2019-03-17 DIAGNOSIS — C787 Secondary malignant neoplasm of liver and intrahepatic bile duct: Secondary | ICD-10-CM | POA: Diagnosis not present

## 2019-03-17 DIAGNOSIS — Z932 Ileostomy status: Secondary | ICD-10-CM | POA: Diagnosis not present

## 2019-04-19 DIAGNOSIS — C787 Secondary malignant neoplasm of liver and intrahepatic bile duct: Secondary | ICD-10-CM | POA: Diagnosis not present

## 2019-04-19 DIAGNOSIS — Z932 Ileostomy status: Secondary | ICD-10-CM | POA: Diagnosis not present

## 2019-04-20 DIAGNOSIS — Z932 Ileostomy status: Secondary | ICD-10-CM | POA: Diagnosis not present

## 2019-04-20 DIAGNOSIS — C787 Secondary malignant neoplasm of liver and intrahepatic bile duct: Secondary | ICD-10-CM | POA: Diagnosis not present

## 2019-04-21 DIAGNOSIS — Z932 Ileostomy status: Secondary | ICD-10-CM | POA: Diagnosis not present

## 2019-04-21 DIAGNOSIS — C787 Secondary malignant neoplasm of liver and intrahepatic bile duct: Secondary | ICD-10-CM | POA: Diagnosis not present

## 2019-04-22 DIAGNOSIS — Z932 Ileostomy status: Secondary | ICD-10-CM | POA: Diagnosis not present

## 2019-04-22 DIAGNOSIS — C787 Secondary malignant neoplasm of liver and intrahepatic bile duct: Secondary | ICD-10-CM | POA: Diagnosis not present

## 2019-04-23 DIAGNOSIS — Z932 Ileostomy status: Secondary | ICD-10-CM | POA: Diagnosis not present

## 2019-04-23 DIAGNOSIS — C787 Secondary malignant neoplasm of liver and intrahepatic bile duct: Secondary | ICD-10-CM | POA: Diagnosis not present

## 2019-04-24 DIAGNOSIS — Z932 Ileostomy status: Secondary | ICD-10-CM | POA: Diagnosis not present

## 2019-04-24 DIAGNOSIS — C787 Secondary malignant neoplasm of liver and intrahepatic bile duct: Secondary | ICD-10-CM | POA: Diagnosis not present

## 2019-04-25 DIAGNOSIS — Z932 Ileostomy status: Secondary | ICD-10-CM | POA: Diagnosis not present

## 2019-04-25 DIAGNOSIS — C787 Secondary malignant neoplasm of liver and intrahepatic bile duct: Secondary | ICD-10-CM | POA: Diagnosis not present

## 2019-04-26 DIAGNOSIS — C787 Secondary malignant neoplasm of liver and intrahepatic bile duct: Secondary | ICD-10-CM | POA: Diagnosis not present

## 2019-04-26 DIAGNOSIS — Z932 Ileostomy status: Secondary | ICD-10-CM | POA: Diagnosis not present

## 2019-04-27 DIAGNOSIS — Z932 Ileostomy status: Secondary | ICD-10-CM | POA: Diagnosis not present

## 2019-04-27 DIAGNOSIS — C787 Secondary malignant neoplasm of liver and intrahepatic bile duct: Secondary | ICD-10-CM | POA: Diagnosis not present

## 2019-04-28 DIAGNOSIS — C787 Secondary malignant neoplasm of liver and intrahepatic bile duct: Secondary | ICD-10-CM | POA: Diagnosis not present

## 2019-04-28 DIAGNOSIS — Z932 Ileostomy status: Secondary | ICD-10-CM | POA: Diagnosis not present

## 2019-04-29 DIAGNOSIS — Z932 Ileostomy status: Secondary | ICD-10-CM | POA: Diagnosis not present

## 2019-04-29 DIAGNOSIS — C787 Secondary malignant neoplasm of liver and intrahepatic bile duct: Secondary | ICD-10-CM | POA: Diagnosis not present

## 2019-04-30 DIAGNOSIS — C787 Secondary malignant neoplasm of liver and intrahepatic bile duct: Secondary | ICD-10-CM | POA: Diagnosis not present

## 2019-04-30 DIAGNOSIS — Z932 Ileostomy status: Secondary | ICD-10-CM | POA: Diagnosis not present

## 2019-05-01 DIAGNOSIS — C787 Secondary malignant neoplasm of liver and intrahepatic bile duct: Secondary | ICD-10-CM | POA: Diagnosis not present

## 2019-05-01 DIAGNOSIS — Z932 Ileostomy status: Secondary | ICD-10-CM | POA: Diagnosis not present

## 2019-05-02 DIAGNOSIS — C787 Secondary malignant neoplasm of liver and intrahepatic bile duct: Secondary | ICD-10-CM | POA: Diagnosis not present

## 2019-05-02 DIAGNOSIS — Z932 Ileostomy status: Secondary | ICD-10-CM | POA: Diagnosis not present

## 2019-05-03 DIAGNOSIS — Z932 Ileostomy status: Secondary | ICD-10-CM | POA: Diagnosis not present

## 2019-05-03 DIAGNOSIS — C787 Secondary malignant neoplasm of liver and intrahepatic bile duct: Secondary | ICD-10-CM | POA: Diagnosis not present

## 2019-05-04 DIAGNOSIS — Z932 Ileostomy status: Secondary | ICD-10-CM | POA: Diagnosis not present

## 2019-05-04 DIAGNOSIS — C787 Secondary malignant neoplasm of liver and intrahepatic bile duct: Secondary | ICD-10-CM | POA: Diagnosis not present

## 2019-05-05 DIAGNOSIS — Z932 Ileostomy status: Secondary | ICD-10-CM | POA: Diagnosis not present

## 2019-05-05 DIAGNOSIS — C787 Secondary malignant neoplasm of liver and intrahepatic bile duct: Secondary | ICD-10-CM | POA: Diagnosis not present

## 2019-05-06 DIAGNOSIS — Z932 Ileostomy status: Secondary | ICD-10-CM | POA: Diagnosis not present

## 2019-05-06 DIAGNOSIS — C787 Secondary malignant neoplasm of liver and intrahepatic bile duct: Secondary | ICD-10-CM | POA: Diagnosis not present

## 2019-05-07 DIAGNOSIS — Z932 Ileostomy status: Secondary | ICD-10-CM | POA: Diagnosis not present

## 2019-05-07 DIAGNOSIS — C787 Secondary malignant neoplasm of liver and intrahepatic bile duct: Secondary | ICD-10-CM | POA: Diagnosis not present

## 2019-05-08 DIAGNOSIS — Z932 Ileostomy status: Secondary | ICD-10-CM | POA: Diagnosis not present

## 2019-05-08 DIAGNOSIS — C787 Secondary malignant neoplasm of liver and intrahepatic bile duct: Secondary | ICD-10-CM | POA: Diagnosis not present

## 2019-05-09 DIAGNOSIS — Z932 Ileostomy status: Secondary | ICD-10-CM | POA: Diagnosis not present

## 2019-05-09 DIAGNOSIS — C787 Secondary malignant neoplasm of liver and intrahepatic bile duct: Secondary | ICD-10-CM | POA: Diagnosis not present

## 2019-05-10 DIAGNOSIS — C787 Secondary malignant neoplasm of liver and intrahepatic bile duct: Secondary | ICD-10-CM | POA: Diagnosis not present

## 2019-05-10 DIAGNOSIS — Z932 Ileostomy status: Secondary | ICD-10-CM | POA: Diagnosis not present

## 2019-05-11 DIAGNOSIS — C787 Secondary malignant neoplasm of liver and intrahepatic bile duct: Secondary | ICD-10-CM | POA: Diagnosis not present

## 2019-05-11 DIAGNOSIS — Z932 Ileostomy status: Secondary | ICD-10-CM | POA: Diagnosis not present

## 2019-05-12 DIAGNOSIS — Z932 Ileostomy status: Secondary | ICD-10-CM | POA: Diagnosis not present

## 2019-05-12 DIAGNOSIS — C787 Secondary malignant neoplasm of liver and intrahepatic bile duct: Secondary | ICD-10-CM | POA: Diagnosis not present

## 2019-05-26 DIAGNOSIS — Z932 Ileostomy status: Secondary | ICD-10-CM | POA: Diagnosis not present

## 2019-05-26 DIAGNOSIS — C787 Secondary malignant neoplasm of liver and intrahepatic bile duct: Secondary | ICD-10-CM | POA: Diagnosis not present

## 2019-11-08 DIAGNOSIS — Z932 Ileostomy status: Secondary | ICD-10-CM | POA: Diagnosis not present

## 2019-11-08 DIAGNOSIS — C787 Secondary malignant neoplasm of liver and intrahepatic bile duct: Secondary | ICD-10-CM | POA: Diagnosis not present

## 2020-01-31 DIAGNOSIS — C787 Secondary malignant neoplasm of liver and intrahepatic bile duct: Secondary | ICD-10-CM | POA: Diagnosis not present

## 2020-01-31 DIAGNOSIS — Z932 Ileostomy status: Secondary | ICD-10-CM | POA: Diagnosis not present

## 2020-04-10 DIAGNOSIS — C787 Secondary malignant neoplasm of liver and intrahepatic bile duct: Secondary | ICD-10-CM | POA: Diagnosis not present

## 2020-04-10 DIAGNOSIS — Z932 Ileostomy status: Secondary | ICD-10-CM | POA: Diagnosis not present

## 2020-05-11 DIAGNOSIS — Z932 Ileostomy status: Secondary | ICD-10-CM | POA: Diagnosis not present

## 2020-05-11 DIAGNOSIS — C787 Secondary malignant neoplasm of liver and intrahepatic bile duct: Secondary | ICD-10-CM | POA: Diagnosis not present

## 2020-05-21 DIAGNOSIS — Z20822 Contact with and (suspected) exposure to covid-19: Secondary | ICD-10-CM | POA: Diagnosis not present

## 2020-06-04 DIAGNOSIS — C787 Secondary malignant neoplasm of liver and intrahepatic bile duct: Secondary | ICD-10-CM | POA: Diagnosis not present

## 2020-06-04 DIAGNOSIS — Z932 Ileostomy status: Secondary | ICD-10-CM | POA: Diagnosis not present

## 2020-07-06 DIAGNOSIS — C787 Secondary malignant neoplasm of liver and intrahepatic bile duct: Secondary | ICD-10-CM | POA: Diagnosis not present

## 2020-07-06 DIAGNOSIS — Z932 Ileostomy status: Secondary | ICD-10-CM | POA: Diagnosis not present

## 2020-09-30 DIAGNOSIS — C787 Secondary malignant neoplasm of liver and intrahepatic bile duct: Secondary | ICD-10-CM | POA: Diagnosis not present

## 2020-09-30 DIAGNOSIS — Z932 Ileostomy status: Secondary | ICD-10-CM | POA: Diagnosis not present

## 2020-10-30 DIAGNOSIS — C787 Secondary malignant neoplasm of liver and intrahepatic bile duct: Secondary | ICD-10-CM | POA: Diagnosis not present

## 2020-10-30 DIAGNOSIS — Z932 Ileostomy status: Secondary | ICD-10-CM | POA: Diagnosis not present

## 2021-02-04 DIAGNOSIS — C787 Secondary malignant neoplasm of liver and intrahepatic bile duct: Secondary | ICD-10-CM | POA: Diagnosis not present

## 2021-02-04 DIAGNOSIS — Z932 Ileostomy status: Secondary | ICD-10-CM | POA: Diagnosis not present

## 2021-04-02 DIAGNOSIS — Z932 Ileostomy status: Secondary | ICD-10-CM | POA: Diagnosis not present

## 2021-04-02 DIAGNOSIS — C787 Secondary malignant neoplasm of liver and intrahepatic bile duct: Secondary | ICD-10-CM | POA: Diagnosis not present

## 2021-07-07 DIAGNOSIS — Z932 Ileostomy status: Secondary | ICD-10-CM | POA: Diagnosis not present

## 2021-07-07 DIAGNOSIS — C787 Secondary malignant neoplasm of liver and intrahepatic bile duct: Secondary | ICD-10-CM | POA: Diagnosis not present

## 2021-10-03 DIAGNOSIS — C787 Secondary malignant neoplasm of liver and intrahepatic bile duct: Secondary | ICD-10-CM | POA: Diagnosis not present

## 2021-10-03 DIAGNOSIS — Z932 Ileostomy status: Secondary | ICD-10-CM | POA: Diagnosis not present

## 2021-10-23 DIAGNOSIS — C787 Secondary malignant neoplasm of liver and intrahepatic bile duct: Secondary | ICD-10-CM | POA: Diagnosis not present

## 2021-10-23 DIAGNOSIS — Z932 Ileostomy status: Secondary | ICD-10-CM | POA: Diagnosis not present

## 2021-10-29 DIAGNOSIS — C787 Secondary malignant neoplasm of liver and intrahepatic bile duct: Secondary | ICD-10-CM | POA: Diagnosis not present

## 2021-10-29 DIAGNOSIS — Z932 Ileostomy status: Secondary | ICD-10-CM | POA: Diagnosis not present

## 2021-12-18 DIAGNOSIS — Z932 Ileostomy status: Secondary | ICD-10-CM | POA: Diagnosis not present

## 2021-12-18 DIAGNOSIS — C787 Secondary malignant neoplasm of liver and intrahepatic bile duct: Secondary | ICD-10-CM | POA: Diagnosis not present

## 2022-02-16 DIAGNOSIS — Z932 Ileostomy status: Secondary | ICD-10-CM | POA: Diagnosis not present

## 2022-02-16 DIAGNOSIS — C787 Secondary malignant neoplasm of liver and intrahepatic bile duct: Secondary | ICD-10-CM | POA: Diagnosis not present

## 2022-04-15 DIAGNOSIS — Z932 Ileostomy status: Secondary | ICD-10-CM | POA: Diagnosis not present

## 2022-04-15 DIAGNOSIS — C787 Secondary malignant neoplasm of liver and intrahepatic bile duct: Secondary | ICD-10-CM | POA: Diagnosis not present

## 2022-10-14 ENCOUNTER — Ambulatory Visit: Payer: Commercial Managed Care - PPO | Admitting: Family

## 2022-11-20 ENCOUNTER — Telehealth: Payer: 59 | Admitting: Family Medicine

## 2022-11-20 DIAGNOSIS — U071 COVID-19: Secondary | ICD-10-CM

## 2022-11-20 MED ORDER — NIRMATRELVIR/RITONAVIR (PAXLOVID)TABLET: 3.0000 | ORAL_TABLET | Freq: Two times a day (BID) | ORAL | 0 refills | Status: AC

## 2022-11-20 NOTE — Patient Instructions (Signed)

## 2022-11-20 NOTE — Progress Notes (Signed)
Virtual Visit Consent   Karen [name], you are scheduled for a virtual visit with a [facility] provider today. Just as with appointments in the office, your consent must be obtained to participate. Your consent will be active for this visit and any virtual visit you may have with one of our providers in the next 365 days. If you have a MyChart account, a copy of this consent can be sent to you electronically.  As this is a virtual visit, video technology does not allow for your provider to perform a traditional examination. This may limit your provider's ability to fully assess your condition. If your provider identifies any concerns that need to be evaluated in person or the need to arrange testing (such as labs, EKG, etc.), we will make arrangements to do so. Although advances in technology are sophisticated, we cannot ensure that it will always work on either your end or our end. If the connection with a video visit is poor, the visit may have to be switched to a telephone visit. With either a video or telephone visit, we are not always able to ensure that we have a secure connection.  By engaging in this virtual visit, you consent to the provision of healthcare and authorize for your insurance to be billed (if applicable) for the services provided during this visit. Depending on your insurance coverage, you may receive a charge related to this service.  I need to obtain your verbal consent now. Are you willing to proceed with your visit today? Karen [name] has provided verbal consent on 11/20/2022 for a virtual visit (video or telephone). Georgana Curio, FNP  Date: 11/20/2022 7:53 AM  Virtual Visit via Video Note   I, Georgana Curio, connected with  Karen [name]  ([id number], [birth year]) on 11/20/22 at  7:45 AM EDT by a video-enabled telemedicine application and verified that I am speaking with the correct person using two identifiers.  Location: Patient: Virtual Visit Location Patient:  Home Provider: Virtual Visit Location Provider: Home Office   I discussed the limitations of evaluation and management by telemedicine and the availability of in person appointments. The patient expressed understanding and agreed to proceed.    History of Present Illness: Karen [name] is a 67 y.o. who identifies as a female who was assigned female at birth, and is being seen today for headache, congestion, fatigue, with positive covid in home testing yesterday. Sx started yesterday. She is 67 yo with obesity and crohns. She is in no distress.   HPI: HPI  Problems: There are no problems to display for this patient.   Allergies:  Allergies  Allergen Reactions   Imuran [Azathioprine] Anaphylaxis and Other (See Comments)   Lialda [Mesalamine] Anaphylaxis   Remicade [Infliximab] Anaphylaxis   Shellfish Allergy Anaphylaxis   Sulfa Antibiotics Anaphylaxis   Ivp Dye [Iodinated Contrast Media] Rash   Medications:  Current Outpatient Medications:    nirmatrelvir/ritonavir (PAXLOVID) 20 x 150 MG & 10 x 100MG  TABS, Take 3 tablets by mouth 2 (two) times daily for 5 days. (Take nirmatrelvir 150 mg two tablets twice daily for 5 days and ritonavir 100 mg one tablet twice daily for 5 days) Patient GFR is normal., Disp: 30 tablet, Rfl: 0   aspirin EC 325 MG tablet, Take 1 tablet (325 mg total) by mouth daily., Disp: 30 tablet, Rfl: 0   HYDROcodone-acetaminophen (NORCO) 5-325 MG tablet, Take 1-2 tablets by mouth every 6 (six) hours as needed., Disp: 90 tablet, Rfl: 0  meloxicam (MOBIC) 7.5 MG tablet, Take 2 tablets (15 mg total) by mouth daily. Take daily for first 7-10 days then as needed for pain and swelling, Disp: 90 tablet, Rfl: 1  Observations/Objective: Patient is well-developed, well-nourished in no acute distress.  Resting comfortably  at home.  Head is normocephalic, atraumatic.  No labored breathing.  Speech is clear and coherent with logical content.  Patient is alert and oriented at  baseline.    Assessment and Plan: 1. COVID-19  Increase fluids, med use discussed, see information sheet, quarantine discussed, Urgent care if sx worsen   Follow Up Instructions: I discussed the assessment and treatment plan with the patient. The patient was provided an opportunity to ask questions and all were answered. The patient agreed with the plan and demonstrated an understanding of the instructions.  A copy of instructions were sent to the patient via MyChart unless otherwise noted below.     The patient was advised to call back or seek an in-person evaluation if the symptoms worsen or if the condition fails to improve as anticipated.  Time:  I spent 10 minutes with the patient via telehealth technology discussing the above problems/concerns.    Georgana Curio, FNP

## 2023-05-18 ENCOUNTER — Telehealth: Payer: 59 | Admitting: Physician Assistant

## 2023-05-18 DIAGNOSIS — J208 Acute bronchitis due to other specified organisms: Secondary | ICD-10-CM

## 2023-05-18 DIAGNOSIS — B9689 Other specified bacterial agents as the cause of diseases classified elsewhere: Secondary | ICD-10-CM | POA: Diagnosis not present

## 2023-05-18 MED ORDER — BENZONATATE 100 MG PO CAPS: 100.0000 mg | ORAL_CAPSULE | Freq: Three times a day (TID) | ORAL | 0 refills | Status: DC | PRN

## 2023-05-18 MED ORDER — ALBUTEROL SULFATE HFA 108 (90 BASE) MCG/ACT IN AERS: 2.0000 | INHALATION_SPRAY | Freq: Four times a day (QID) | RESPIRATORY_TRACT | 0 refills | Status: DC | PRN

## 2023-05-18 MED ORDER — DOXYCYCLINE HYCLATE 100 MG PO TABS: 100.0000 mg | ORAL_TABLET | Freq: Two times a day (BID) | ORAL | 0 refills | Status: AC

## 2023-05-18 NOTE — Patient Instructions (Signed)
 Karen [name], thank you for joining Karen Velma Lunger, PA-C for today's virtual visit.  While this provider is not your primary care provider (PCP), if your PCP is located in our provider database this encounter information will be shared with them immediately following your visit.   A [facility] MyChart account gives you access to today's visit and all your visits, tests, and labs performed at [facility]  click here if you don't have a [facility] MyChart account or go to [email or web address]  Consent: (Patient) Karen [name] provided verbal consent for this virtual visit at the beginning of the encounter.  Current Medications:  Current Outpatient Medications:    aspirin  EC 325 MG tablet, Take 1 tablet (325 mg total) by mouth daily., Disp: 30 tablet, Rfl: 0   HYDROcodone -acetaminophen  (NORCO) 5-325 MG tablet, Take 1-2 tablets by mouth every 6 (six) hours as needed., Disp: 90 tablet, Rfl: 0   meloxicam  (MOBIC ) 7.5 MG tablet, Take 2 tablets (15 mg total) by mouth daily. Take daily for first 7-10 days then as needed for pain and swelling, Disp: 90 tablet, Rfl: 1   Medications ordered in this encounter:  No orders of the defined types were placed in this encounter.    *If you need refills on other medications prior to your next appointment, please contact your pharmacy*  Follow-Up: Call back or seek an in-person evaluation if the symptoms worsen or if the condition fails to improve as anticipated.  [facility] [phone]  Other Instructions Take antibiotic (Doxycycline ) as directed.  Increase fluids.  Get plenty of rest. Use Mucinex for congestion. Use the Tessalon  and Albuterol  as directed. Take a daily probiotic (I recommend Align or Culturelle, but even Activia Yogurt may be beneficial).  A humidifier placed in the bedroom may offer some relief for a dry, scratchy throat of nasal irritation.  Read information below on acute bronchitis. Please  call or return to clinic if symptoms are not improving.  Acute Bronchitis Bronchitis is when the airways that extend from the windpipe into the lungs get red, puffy, and painful (inflamed). Bronchitis often causes thick spit (mucus) to develop. This leads to a cough. A cough is the most common symptom of bronchitis. In acute bronchitis, the condition usually begins suddenly and goes away over time (usually in 2 weeks). Smoking, allergies, and asthma can make bronchitis worse. Repeated episodes of bronchitis may cause more lung problems.  HOME CARE Rest. Drink enough fluids to keep your pee (urine) clear or pale yellow (unless you need to limit fluids as told by your doctor). Only take over-the-counter or prescription medicines as told by your doctor. Avoid smoking and secondhand smoke. These can make bronchitis worse. If you are a smoker, think about using nicotine gum or skin patches. Quitting smoking will help your lungs heal faster. Reduce the chance of getting bronchitis again by: Washing your hands often. Avoiding people with cold symptoms. Trying not to touch your hands to your mouth, nose, or eyes. Follow up with your doctor as told.  GET HELP IF: Your symptoms do not improve after 1 week of treatment. Symptoms include: Cough. Fever. Coughing up thick spit. Body aches. Chest congestion. Chills. Shortness of breath. Sore throat.  GET HELP RIGHT AWAY IF:  You have an increased fever. You have chills. You have severe shortness of breath. You have bloody thick spit (sputum). You throw up (vomit) often. You lose too much body fluid (dehydration). You have a severe headache. You  faint.  MAKE SURE YOU:  Understand these instructions. Will watch your condition. Will get help right away if you are not doing well or get worse. Document Released: 10/14/2007 Document Revised: 12/28/2012 Document Reviewed: 10/18/2012 [facility] Patient Information 2015 [place], [place]. This  information is not intended to replace advice given to you by your health care provider. Make sure you discuss any questions you have with your health care provider.    If you have been instructed to have an in-person evaluation today at a local Urgent Care facility, please use the link below. It will take you to a list of all of our available [facility], including address, phone number and hours of operation. Please do not delay care.  [facility]  If you or a family member do not have a primary care provider, use the link below to schedule a visit and establish care. When you choose a [facility] primary care physician or advanced practice provider, you gain a long-term partner in health. Find a Primary Care Provider  Learn more about [facility]'s in-office and virtual care options: [facility] - Get Care Now

## 2023-05-18 NOTE — Progress Notes (Signed)
 Virtual Visit Consent   Karen [name], you are scheduled for a virtual visit with a [facility] provider today. Just as with appointments in the office, your consent must be obtained to participate. Your consent will be active for this visit and any virtual visit you may have with one of our providers in the next 365 days. If you have a MyChart account, a copy of this consent can be sent to you electronically.  As this is a virtual visit, video technology does not allow for your provider to perform a traditional examination. This may limit your provider's ability to fully assess your condition. If your provider identifies any concerns that need to be evaluated in person or the need to arrange testing (such as labs, EKG, etc.), we will make arrangements to do so. Although advances in technology are sophisticated, we cannot ensure that it will always work on either your end or our end. If the connection with a video visit is poor, the visit may have to be switched to a telephone visit. With either a video or telephone visit, we are not always able to ensure that we have a secure connection.  By engaging in this virtual visit, you consent to the provision of healthcare and authorize for your insurance to be billed (if applicable) for the services provided during this visit. Depending on your insurance coverage, you may receive a charge related to this service.  I need to obtain your verbal consent now. Are you willing to proceed with your visit today? MARIKA MAHAFFY has provided verbal consent on 05/18/2023 for a virtual visit (video or telephone). Karen [name], [place]  Date: 05/18/2023 7:52 AM  Virtual Visit via Video Note   I, Karen [name], connected with  CHRISSI CROW  ([id number], 1955-07-10) on 05/18/23 at  7:45 AM EST by a video-enabled telemedicine application and verified that I am speaking with the correct person using two identifiers.  Location: Patient: Virtual Visit  Location Patient: Home Provider: Virtual Visit Location Provider: Home Office   I discussed the limitations of evaluation and management by telemedicine and the availability of in person appointments. The patient expressed understanding and agreed to proceed.    History of Present Illness: Karen [name] is a 68 y.o. who identifies as a female who was assigned female at birth, and is being seen today for ongoing URI symptoms over the past couple of weeks. Initially with nasal and head congestion and thought was viral from her grandchildren. Symptoms initially improved but worsening again over the past 2 days. Notes increased chest congestion and cough that is productive of thick phlegm. Fever as of last night.   Flu/COVID testing negative.   HPI: HPI  Problems: There are no active problems to display for this patient.   Allergies:  Allergies  Allergen Reactions   Imuran [Azathioprine] Anaphylaxis and Other (See Comments)   Lialda [Mesalamine] Anaphylaxis   Remicade  [Infliximab ] Anaphylaxis   Shellfish Allergy Anaphylaxis   Sulfa Antibiotics Anaphylaxis   Ivp Dye [Iodinated Contrast Media] Rash   Medications:  Current Outpatient Medications:    albuterol  (VENTOLIN  HFA) 108 (90 Base) MCG/ACT inhaler, Inhale 2 puffs into the lungs every 6 (six) hours as needed for wheezing or shortness of breath., Disp: 8 g, Rfl: 0   benzonatate  (TESSALON ) 100 MG capsule, Take 1 capsule (100 mg total) by mouth 3 (three) times daily as needed for cough., Disp: 30 capsule, Rfl: 0   doxycycline  (VIBRA -TABS) 100 MG tablet,  Take 1 tablet (100 mg total) by mouth 2 (two) times daily., Disp: 14 tablet, Rfl: 0  Observations/Objective: Patient is well-developed, well-nourished in no acute distress.  Resting comfortably at home.  Head is normocephalic, atraumatic.  No labored breathing. Speech is clear and coherent with logical content.  Patient is alert and oriented at baseline.   Assessment and Plan: 1.  Acute bacterial bronchitis (Primary) - benzonatate  (TESSALON ) 100 MG capsule; Take 1 capsule (100 mg total) by mouth 3 (three) times daily as needed for cough.  Dispense: 30 capsule; Refill: 0 - doxycycline  (VIBRA -TABS) 100 MG tablet; Take 1 tablet (100 mg total) by mouth 2 (two) times daily.  Dispense: 14 tablet; Refill: 0 - albuterol  (VENTOLIN  HFA) 108 (90 Base) MCG/ACT inhaler; Inhale 2 puffs into the lungs every 6 (six) hours as needed for wheezing or shortness of breath.  Dispense: 8 g; Refill: 0  Rx Doxycycline .  Increase fluids.  Rest.  Saline nasal spray.  Probiotic.  Mucinex as directed.  Humidifier in bedroom. Tessalon  and Albuterol  per orders.  Call or return to clinic if symptoms are not improving.   Follow Up Instructions: I discussed the assessment and treatment plan with the patient. The patient was provided an opportunity to ask questions and all were answered. The patient agreed with the plan and demonstrated an understanding of the instructions.  A copy of instructions were sent to the patient via MyChart unless otherwise noted below.   The patient was advised to call back or seek an in-person evaluation if the symptoms worsen or if the condition fails to improve as anticipated.    Karen Velma Lunger, PA-C

## 2023-07-07 ENCOUNTER — Telehealth: Payer: 59

## 2023-07-07 DIAGNOSIS — R058 Other specified cough: Secondary | ICD-10-CM | POA: Diagnosis not present

## 2023-07-07 MED ORDER — BENZONATATE 100 MG PO CAPS: 100.0000 mg | ORAL_CAPSULE | Freq: Three times a day (TID) | ORAL | 0 refills | Status: AC | PRN

## 2023-07-07 MED ORDER — ALBUTEROL SULFATE HFA 108 (90 BASE) MCG/ACT IN AERS: 2.0000 | INHALATION_SPRAY | Freq: Four times a day (QID) | RESPIRATORY_TRACT | 0 refills | Status: AC | PRN

## 2023-07-07 MED ORDER — PREDNISONE 10 MG (21) PO TBPK: ORAL_TABLET | ORAL | 0 refills | Status: AC

## 2023-07-07 NOTE — Progress Notes (Signed)
 Virtual Visit Consent   [name], you are scheduled for a virtual visit with a [facility] provider today. Just as with appointments in the office, your consent must be obtained to participate. Your consent will be active for this visit and any virtual visit you may have with one of our providers in the next 365 days. If you have a MyChart account, a copy of this consent can be sent to you electronically.  As this is a virtual visit, video technology does not allow for your provider to perform a traditional examination. This may limit your provider's ability to fully assess your condition. If your provider identifies any concerns that need to be evaluated in person or the need to arrange testing (such as labs, EKG, etc.), we will make arrangements to do so. Although advances in technology are sophisticated, we cannot ensure that it will always work on either your end or our end. If the connection with a video visit is poor, the visit may have to be switched to a telephone visit. With either a video or telephone visit, we are not always able to ensure that we have a secure connection.  By engaging in this virtual visit, you consent to the provision of healthcare and authorize for your insurance to be billed (if applicable) for the services provided during this visit. Depending on your insurance coverage, you may receive a charge related to this service.  I need to obtain your verbal consent now. Are you willing to proceed with your visit today? Karen [name] has provided verbal consent on 07/07/2023 for a virtual visit (video or telephone). [name], PA-C  Date: 07/07/2023 7:53 AM   Virtual Visit via Video Note   I, [name], connected with  Karen [name]  ([id number], [birth year]) on 07/07/23 at  7:45 AM EST by a video-enabled telemedicine application and verified that I am speaking with the correct person using two identifiers.  Location: Patient: Virtual Visit  Location Patient: Home Provider: Virtual Visit Location Provider: Home Office   I discussed the limitations of evaluation and management by telemedicine and the availability of in person appointments. The patient expressed understanding and agreed to proceed.    History of Present Illness: Karen [name] is a 68 y.o. who identifies as a female who was assigned female at birth, and is being seen today for cough.  HPI: Cough This is a chronic problem. The current episode started more than 1 month ago (Started around [place], seen virtually on 05/18/23). The problem has been unchanged. The problem occurs every few minutes. The cough is Non-productive. Pertinent negatives include no chills, ear congestion, ear pain, fever, nasal congestion, postnasal drip, shortness of breath or wheezing. The symptoms are aggravated by lying down and cold air (talking). She has tried body position changes, a beta-agonist inhaler, prescription cough suppressant, OTC cough suppressant and rest (doxycycline) for the symptoms. The treatment provided mild relief. Her past medical history is significant for bronchitis and pneumonia. There is no history of asthma.  Overall, feels well, just a dry, hacking cough when she talks for periods of time.    Problems: There are no active problems to display for this patient.   Allergies:  Allergies  Allergen Reactions   Imuran [Azathioprine] Anaphylaxis and Other (See Comments)   Lialda [Mesalamine] Anaphylaxis   Remicade [Infliximab] Anaphylaxis   Shellfish Allergy Anaphylaxis   Sulfa Antibiotics Anaphylaxis   Ivp Dye [Iodinated Contrast Media] Rash   Medications:  Current Outpatient Medications:    predniSONE (STERAPRED UNI-PAK 21 TAB) 10 MG (21) TBPK tablet, 6 day taper take as directed on package instructions, Disp: 21 tablet, Rfl: 0   albuterol (VENTOLIN HFA) 108 (90 Base) MCG/ACT inhaler, Inhale 2 puffs into the lungs every 6 (six) hours as needed for wheezing or  shortness of breath., Disp: 8 g, Rfl: 0   benzonatate (TESSALON) 100 MG capsule, Take 1 capsule (100 mg total) by mouth 3 (three) times daily as needed for cough., Disp: 30 capsule, Rfl: 0   doxycycline (VIBRA-TABS) 100 MG tablet, Take 1 tablet (100 mg total) by mouth 2 (two) times daily., Disp: 14 tablet, Rfl: 0  Observations/Objective: Patient is well-developed, well-nourished in no acute distress.  Resting comfortably at home.  Head is normocephalic, atraumatic.  No labored breathing.  Speech is clear and coherent with logical content.  Patient is alert and oriented at baseline.  Dry, hacking cough heard  Assessment and Plan: 1. Post-viral cough syndrome (Primary) - predniSONE (STERAPRED UNI-PAK 21 TAB) 10 MG (21) TBPK tablet; 6 day taper take as directed on package instructions  Dispense: 21 tablet; Refill: 0 - albuterol (VENTOLIN HFA) 108 (90 Base) MCG/ACT inhaler; Inhale 2 puffs into the lungs every 6 (six) hours as needed for wheezing or shortness of breath.  Dispense: 8 g; Refill: 0 - benzonatate (TESSALON) 100 MG capsule; Take 1 capsule (100 mg total) by mouth 3 (three) times daily as needed for cough.  Dispense: 30 capsule; Refill: 0  - Lingering dry, hacking cough following recent severe URI/PNA - Will treat with Prednisone taper - Tessalon for cough - Albuterol refilled just in case it is needed longer - Can continue Mucinex  - Push fluids.  - Rest.  - Steam and humidifier can help - Seek in person evaluation if worsening or symptoms fail to improve    Follow Up Instructions: I discussed the assessment and treatment plan with the patient. The patient was provided an opportunity to ask questions and all were answered. The patient agreed with the plan and demonstrated an understanding of the instructions.  A copy of instructions were sent to the patient via MyChart unless otherwise noted below.    The patient was advised to call back or seek an in-person evaluation if the  symptoms worsen or if the condition fails to improve as anticipated.    [name], PA-C

## 2023-07-07 NOTE — Patient Instructions (Signed)
 Karen [name], thank you for joining [name], PA-C for today's virtual visit.  While this provider is not your primary care provider (PCP), if your PCP is located in our provider database this encounter information will be shared with them immediately following your visit.   A [facility] MyChart account gives you access to today's visit and all your visits, tests, and labs performed at [facility] " click here if you don't have a [facility] MyChart account or go to [email or web address]  Consent: (Patient) Karen [name] provided verbal consent for this virtual visit at the beginning of the encounter.  Current Medications:  Current Outpatient Medications:    predniSONE (STERAPRED UNI-PAK 21 TAB) 10 MG (21) TBPK tablet, 6 day taper take as directed on package instructions, Disp: 21 tablet, Rfl: 0   albuterol (VENTOLIN HFA) 108 (90 Base) MCG/ACT inhaler, Inhale 2 puffs into the lungs every 6 (six) hours as needed for wheezing or shortness of breath., Disp: 8 g, Rfl: 0   benzonatate (TESSALON) 100 MG capsule, Take 1 capsule (100 mg total) by mouth 3 (three) times daily as needed for cough., Disp: 30 capsule, Rfl: 0   doxycycline (VIBRA-TABS) 100 MG tablet, Take 1 tablet (100 mg total) by mouth 2 (two) times daily., Disp: 14 tablet, Rfl: 0   Medications ordered in this encounter:  Meds ordered this encounter  Medications   predniSONE (STERAPRED UNI-PAK 21 TAB) 10 MG (21) TBPK tablet    Sig: 6 day taper take as directed on package instructions    Dispense:  21 tablet    Refill:  0    Supervising Provider:   Merrilee Jansky [[id number]]   albuterol (VENTOLIN HFA) 108 (90 Base) MCG/ACT inhaler    Sig: Inhale 2 puffs into the lungs every 6 (six) hours as needed for wheezing or shortness of breath.    Dispense:  8 g    Refill:  0    Supervising Provider:   Merrilee Jansky [[id number]]   benzonatate (TESSALON) 100 MG capsule    Sig: Take 1 capsule (100 mg total)  by mouth 3 (three) times daily as needed for cough.    Dispense:  30 capsule    Refill:  0    Supervising Provider:   Merrilee Jansky [[id number]]     *If you need refills on other medications prior to your next appointment, please contact your pharmacy*  Follow-Up: Call back or seek an in-person evaluation if the symptoms worsen or if the condition fails to improve as anticipated.  [facility] [phone]  Other Instructions  Cough, Adult Coughing is a reflex that clears your throat and airways (respiratory system). It helps heal and protect your lungs. It is normal to cough from time to time. A cough that happens with other symptoms or that lasts a long time may be a sign of a condition that needs treatment. A short-term (acute) cough may only last 2-3 weeks. A long-term (chronic) cough may last 8 or more weeks. Coughing is often caused by: Diseases, such as: An infection of the respiratory system. Asthma or other heart or lung diseases. Gastroesophageal reflux. This is when acid comes back up from the stomach. Breathing in things that irritate your lungs. Allergies. Postnasal drip. This is when mucus runs down the back of your throat. Smoking. Some medicines. Follow these instructions at home: Medicines Take over-the-counter and prescription medicines only as told by your health care provider. Talk with your  provider before you take cough medicine (cough suppressants). Eating and drinking Do not drink alcohol. Avoid caffeine. Drink enough fluid to keep your pee (urine) pale yellow. Lifestyle Avoid cigarette smoke. Do not use any products that contain nicotine or tobacco. These products include cigarettes, chewing tobacco, and vaping devices, such as e-cigarettes. If you need help quitting, ask your provider. Avoid things that make you cough. These may include perfumes, candles, cleaning products, or campfire smoke. General instructions  Watch for any changes  to your cough. Tell your provider about them. Always cover your mouth when you cough. If the air is dry in your bedroom or home, use a cool mist vaporizer or humidifier. If your cough is worse at night, try to sleep in a semi-upright position. Rest as needed. Contact a health care provider if: You have new symptoms, or your symptoms get worse. You cough up pus. You have a fever that does not go away or a cough that does not get better after 2-3 weeks. You cannot control your cough with medicine, and you are losing sleep. You have pain that gets worse or is not helped with medicine. You lose weight for no clear reason. You have night sweats. Get help right away if: You cough up blood. You have trouble breathing. Your heart is beating very fast. These symptoms may be an emergency. Get help right away. Call 911. Do not wait to see if the symptoms will go away. Do not drive yourself to the hospital. This information is not intended to replace advice given to you by your health care provider. Make sure you discuss any questions you have with your health care provider. Document Revised: 12/26/2021 Document Reviewed: 12/26/2021 Elsevier Patient Education  2024 [employer]   If you have been instructed to have an in-person evaluation today at a local Urgent Care facility, please use the link below. It will take you to a list of all of our available [facility], including address, phone number and hours of operation. Please do not delay care.  [facility]  If you or a family member do not have a primary care provider, use the link below to schedule a visit and establish care. When you choose a [facility] primary care physician or advanced practice provider, you gain a long-term partner in health. Find a Primary Care Provider  Learn more about [facility] in-office and virtual care options:  - Get Care Now
